# Patient Record
Sex: Female | Born: 1975 | Race: Black or African American | Hispanic: No | Marital: Married | Smoking: Never smoker
Health system: Southern US, Community
[De-identification: ages and names within clinical notes are randomized; demographics above are authoritative.]

## PROBLEM LIST (undated history)

## (undated) DIAGNOSIS — O24419 Gestational diabetes mellitus in pregnancy, unspecified control: Secondary | ICD-10-CM

---

## 1998-05-26 ENCOUNTER — Other Ambulatory Visit: Admission: RE | Admit: 1998-05-26 | Discharge: 1998-05-26 | Payer: Self-pay | Admitting: Obstetrics

## 1998-05-26 ENCOUNTER — Encounter: Admission: RE | Admit: 1998-05-26 | Discharge: 1998-05-26 | Payer: Self-pay | Admitting: Obstetrics

## 1998-06-30 ENCOUNTER — Encounter: Admission: RE | Admit: 1998-06-30 | Discharge: 1998-06-30 | Payer: Self-pay | Admitting: Obstetrics & Gynecology

## 1998-08-28 ENCOUNTER — Encounter: Admission: RE | Admit: 1998-08-28 | Discharge: 1998-08-28 | Payer: Self-pay | Admitting: Obstetrics & Gynecology

## 1998-08-28 ENCOUNTER — Other Ambulatory Visit: Admission: RE | Admit: 1998-08-28 | Discharge: 1998-08-28 | Payer: Self-pay | Admitting: Obstetrics & Gynecology

## 1998-09-11 ENCOUNTER — Encounter: Admission: RE | Admit: 1998-09-11 | Discharge: 1998-09-11 | Payer: Self-pay | Admitting: Obstetrics & Gynecology

## 1998-10-02 ENCOUNTER — Encounter: Admission: RE | Admit: 1998-10-02 | Discharge: 1998-10-02 | Payer: Self-pay | Admitting: Obstetrics & Gynecology

## 1998-10-07 ENCOUNTER — Encounter: Admission: RE | Admit: 1998-10-07 | Discharge: 1998-10-07 | Payer: Self-pay | Admitting: Obstetrics & Gynecology

## 1999-11-29 ENCOUNTER — Other Ambulatory Visit: Admission: RE | Admit: 1999-11-29 | Discharge: 1999-11-29 | Payer: Self-pay | Admitting: Family Medicine

## 2002-11-08 ENCOUNTER — Encounter: Payer: Self-pay | Admitting: Family Medicine

## 2002-11-08 ENCOUNTER — Inpatient Hospital Stay (HOSPITAL_COMMUNITY): Admission: AD | Admit: 2002-11-08 | Discharge: 2002-11-08 | Payer: Self-pay | Admitting: Family Medicine

## 2004-10-01 ENCOUNTER — Ambulatory Visit: Payer: Self-pay | Admitting: Family Medicine

## 2004-10-27 ENCOUNTER — Ambulatory Visit: Payer: Self-pay | Admitting: *Deleted

## 2004-10-29 ENCOUNTER — Ambulatory Visit: Payer: Self-pay | Admitting: Family Medicine

## 2005-02-28 ENCOUNTER — Ambulatory Visit: Payer: Self-pay | Admitting: Family Medicine

## 2005-03-02 ENCOUNTER — Inpatient Hospital Stay (HOSPITAL_COMMUNITY): Admission: AD | Admit: 2005-03-02 | Discharge: 2005-03-02 | Payer: Self-pay | Admitting: Family Medicine

## 2005-04-18 ENCOUNTER — Inpatient Hospital Stay (HOSPITAL_COMMUNITY): Admission: AD | Admit: 2005-04-18 | Discharge: 2005-04-20 | Payer: Self-pay | Admitting: Obstetrics & Gynecology

## 2005-04-28 ENCOUNTER — Encounter: Admission: RE | Admit: 2005-04-28 | Discharge: 2005-05-26 | Payer: Self-pay | Admitting: Obstetrics & Gynecology

## 2005-05-12 ENCOUNTER — Inpatient Hospital Stay (HOSPITAL_COMMUNITY): Admission: AD | Admit: 2005-05-12 | Discharge: 2005-05-16 | Payer: Self-pay | Admitting: Obstetrics

## 2005-05-16 ENCOUNTER — Encounter (INDEPENDENT_AMBULATORY_CARE_PROVIDER_SITE_OTHER): Payer: Self-pay | Admitting: *Deleted

## 2006-08-21 ENCOUNTER — Inpatient Hospital Stay (HOSPITAL_COMMUNITY): Admission: AD | Admit: 2006-08-21 | Discharge: 2006-08-21 | Payer: Self-pay | Admitting: Gynecology

## 2006-08-30 ENCOUNTER — Inpatient Hospital Stay (HOSPITAL_COMMUNITY): Admission: AD | Admit: 2006-08-30 | Discharge: 2006-08-30 | Payer: Self-pay | Admitting: Obstetrics and Gynecology

## 2006-09-12 ENCOUNTER — Ambulatory Visit (HOSPITAL_COMMUNITY): Admission: RE | Admit: 2006-09-12 | Discharge: 2006-09-12 | Payer: Self-pay | Admitting: Family Medicine

## 2006-09-18 ENCOUNTER — Ambulatory Visit: Payer: Self-pay | Admitting: Family Medicine

## 2006-09-19 ENCOUNTER — Ambulatory Visit (HOSPITAL_COMMUNITY): Admission: RE | Admit: 2006-09-19 | Discharge: 2006-09-19 | Payer: Self-pay | Admitting: Obstetrics & Gynecology

## 2006-09-21 ENCOUNTER — Ambulatory Visit: Payer: Self-pay | Admitting: Obstetrics & Gynecology

## 2006-09-25 ENCOUNTER — Inpatient Hospital Stay (HOSPITAL_COMMUNITY): Admission: AD | Admit: 2006-09-25 | Discharge: 2006-09-25 | Payer: Self-pay | Admitting: Obstetrics & Gynecology

## 2006-10-03 ENCOUNTER — Ambulatory Visit (HOSPITAL_COMMUNITY): Admission: RE | Admit: 2006-10-03 | Discharge: 2006-10-03 | Payer: Self-pay | Admitting: Family Medicine

## 2006-10-05 ENCOUNTER — Ambulatory Visit: Payer: Self-pay | Admitting: Obstetrics & Gynecology

## 2006-10-17 ENCOUNTER — Ambulatory Visit (HOSPITAL_COMMUNITY): Admission: RE | Admit: 2006-10-17 | Discharge: 2006-10-17 | Payer: Self-pay | Admitting: Obstetrics & Gynecology

## 2006-10-18 ENCOUNTER — Ambulatory Visit: Payer: Self-pay | Admitting: *Deleted

## 2006-10-18 ENCOUNTER — Inpatient Hospital Stay (HOSPITAL_COMMUNITY): Admission: AD | Admit: 2006-10-18 | Discharge: 2006-10-20 | Payer: Self-pay | Admitting: Gynecology

## 2006-10-18 ENCOUNTER — Ambulatory Visit: Payer: Self-pay | Admitting: Obstetrics and Gynecology

## 2006-10-25 ENCOUNTER — Ambulatory Visit (HOSPITAL_COMMUNITY): Admission: RE | Admit: 2006-10-25 | Discharge: 2006-10-25 | Payer: Self-pay | Admitting: Obstetrics & Gynecology

## 2006-10-30 ENCOUNTER — Ambulatory Visit: Payer: Self-pay | Admitting: Obstetrics & Gynecology

## 2006-10-31 ENCOUNTER — Ambulatory Visit (HOSPITAL_COMMUNITY): Admission: RE | Admit: 2006-10-31 | Discharge: 2006-10-31 | Payer: Self-pay | Admitting: Obstetrics & Gynecology

## 2006-11-02 ENCOUNTER — Ambulatory Visit: Payer: Self-pay | Admitting: Gynecology

## 2006-11-09 ENCOUNTER — Ambulatory Visit: Payer: Self-pay | Admitting: Obstetrics & Gynecology

## 2006-11-14 ENCOUNTER — Ambulatory Visit: Payer: Self-pay | Admitting: *Deleted

## 2006-11-14 ENCOUNTER — Inpatient Hospital Stay (HOSPITAL_COMMUNITY): Admission: AD | Admit: 2006-11-14 | Discharge: 2006-11-14 | Payer: Self-pay | Admitting: Obstetrics and Gynecology

## 2006-11-16 ENCOUNTER — Ambulatory Visit: Payer: Self-pay | Admitting: Family Medicine

## 2006-11-17 ENCOUNTER — Ambulatory Visit: Payer: Self-pay | Admitting: Family Medicine

## 2006-11-20 ENCOUNTER — Ambulatory Visit: Payer: Self-pay | Admitting: Obstetrics & Gynecology

## 2006-11-20 ENCOUNTER — Ambulatory Visit (HOSPITAL_COMMUNITY): Admission: RE | Admit: 2006-11-20 | Discharge: 2006-11-20 | Payer: Self-pay | Admitting: Obstetrics & Gynecology

## 2006-11-20 ENCOUNTER — Encounter: Admission: RE | Admit: 2006-11-20 | Discharge: 2007-02-18 | Payer: Self-pay | Admitting: Obstetrics & Gynecology

## 2006-11-27 ENCOUNTER — Ambulatory Visit: Payer: Self-pay | Admitting: Obstetrics & Gynecology

## 2006-11-28 ENCOUNTER — Ambulatory Visit (HOSPITAL_COMMUNITY): Admission: RE | Admit: 2006-11-28 | Discharge: 2006-11-28 | Payer: Self-pay | Admitting: Obstetrics & Gynecology

## 2006-12-04 ENCOUNTER — Ambulatory Visit: Payer: Self-pay | Admitting: Family Medicine

## 2006-12-08 ENCOUNTER — Ambulatory Visit (HOSPITAL_COMMUNITY): Admission: RE | Admit: 2006-12-08 | Discharge: 2006-12-08 | Payer: Self-pay | Admitting: Obstetrics & Gynecology

## 2006-12-11 ENCOUNTER — Ambulatory Visit: Payer: Self-pay | Admitting: Obstetrics & Gynecology

## 2006-12-18 ENCOUNTER — Ambulatory Visit: Payer: Self-pay | Admitting: Family Medicine

## 2006-12-21 ENCOUNTER — Ambulatory Visit (HOSPITAL_COMMUNITY): Admission: RE | Admit: 2006-12-21 | Discharge: 2006-12-21 | Payer: Self-pay | Admitting: Obstetrics & Gynecology

## 2006-12-25 ENCOUNTER — Ambulatory Visit: Payer: Self-pay | Admitting: Family Medicine

## 2007-01-01 ENCOUNTER — Ambulatory Visit: Payer: Self-pay | Admitting: Obstetrics & Gynecology

## 2007-01-08 ENCOUNTER — Ambulatory Visit: Payer: Self-pay | Admitting: Family Medicine

## 2007-01-12 ENCOUNTER — Ambulatory Visit (HOSPITAL_COMMUNITY): Admission: RE | Admit: 2007-01-12 | Discharge: 2007-01-12 | Payer: Self-pay | Admitting: Obstetrics & Gynecology

## 2007-01-16 ENCOUNTER — Ambulatory Visit: Payer: Self-pay | Admitting: Obstetrics & Gynecology

## 2007-01-22 ENCOUNTER — Ambulatory Visit: Payer: Self-pay | Admitting: Obstetrics & Gynecology

## 2007-01-24 ENCOUNTER — Ambulatory Visit: Payer: Self-pay | Admitting: Obstetrics & Gynecology

## 2007-01-29 ENCOUNTER — Ambulatory Visit: Payer: Self-pay | Admitting: Obstetrics & Gynecology

## 2007-02-02 ENCOUNTER — Ambulatory Visit (HOSPITAL_COMMUNITY): Admission: RE | Admit: 2007-02-02 | Discharge: 2007-02-02 | Payer: Self-pay | Admitting: Obstetrics & Gynecology

## 2007-02-05 ENCOUNTER — Ambulatory Visit: Payer: Self-pay | Admitting: Family Medicine

## 2007-02-09 ENCOUNTER — Ambulatory Visit: Payer: Self-pay | Admitting: Gynecology

## 2007-02-09 ENCOUNTER — Ambulatory Visit: Payer: Self-pay | Admitting: Physician Assistant

## 2007-02-09 ENCOUNTER — Inpatient Hospital Stay (HOSPITAL_COMMUNITY): Admission: AD | Admit: 2007-02-09 | Discharge: 2007-02-09 | Payer: Self-pay | Admitting: Obstetrics & Gynecology

## 2007-02-12 ENCOUNTER — Ambulatory Visit: Payer: Self-pay | Admitting: Obstetrics & Gynecology

## 2007-02-12 ENCOUNTER — Ambulatory Visit (HOSPITAL_COMMUNITY): Admission: RE | Admit: 2007-02-12 | Discharge: 2007-02-12 | Payer: Self-pay | Admitting: Family Medicine

## 2007-02-16 ENCOUNTER — Encounter: Payer: Self-pay | Admitting: *Deleted

## 2007-02-16 ENCOUNTER — Ambulatory Visit: Payer: Self-pay | Admitting: Obstetrics and Gynecology

## 2007-02-16 ENCOUNTER — Inpatient Hospital Stay (HOSPITAL_COMMUNITY): Admission: AD | Admit: 2007-02-16 | Discharge: 2007-02-16 | Payer: Self-pay | Admitting: Gynecology

## 2007-02-19 ENCOUNTER — Ambulatory Visit: Payer: Self-pay | Admitting: Obstetrics & Gynecology

## 2007-02-22 ENCOUNTER — Ambulatory Visit (HOSPITAL_COMMUNITY): Admission: RE | Admit: 2007-02-22 | Discharge: 2007-02-22 | Payer: Self-pay | Admitting: Gynecology

## 2007-02-22 ENCOUNTER — Ambulatory Visit: Payer: Self-pay | Admitting: Family Medicine

## 2007-02-26 ENCOUNTER — Encounter: Admission: RE | Admit: 2007-02-26 | Discharge: 2007-02-26 | Payer: Self-pay | Admitting: Obstetrics & Gynecology

## 2007-02-26 ENCOUNTER — Inpatient Hospital Stay (HOSPITAL_COMMUNITY): Admission: AD | Admit: 2007-02-26 | Discharge: 2007-02-27 | Payer: Self-pay | Admitting: Obstetrics and Gynecology

## 2007-02-26 ENCOUNTER — Encounter: Payer: Self-pay | Admitting: *Deleted

## 2007-02-26 ENCOUNTER — Ambulatory Visit: Payer: Self-pay | Admitting: Obstetrics and Gynecology

## 2007-02-26 ENCOUNTER — Ambulatory Visit: Payer: Self-pay | Admitting: Obstetrics & Gynecology

## 2007-03-01 ENCOUNTER — Ambulatory Visit: Payer: Self-pay | Admitting: Physician Assistant

## 2007-03-01 ENCOUNTER — Inpatient Hospital Stay (HOSPITAL_COMMUNITY): Admission: AD | Admit: 2007-03-01 | Discharge: 2007-03-01 | Payer: Self-pay | Admitting: Obstetrics & Gynecology

## 2007-03-02 ENCOUNTER — Ambulatory Visit (HOSPITAL_COMMUNITY): Admission: RE | Admit: 2007-03-02 | Discharge: 2007-03-02 | Payer: Self-pay | Admitting: Obstetrics & Gynecology

## 2007-03-05 ENCOUNTER — Ambulatory Visit (HOSPITAL_COMMUNITY): Admission: RE | Admit: 2007-03-05 | Discharge: 2007-03-05 | Payer: Self-pay | Admitting: Family Medicine

## 2007-03-05 ENCOUNTER — Ambulatory Visit: Payer: Self-pay | Admitting: Obstetrics & Gynecology

## 2007-03-09 ENCOUNTER — Ambulatory Visit: Payer: Self-pay | Admitting: Gynecology

## 2007-03-10 ENCOUNTER — Inpatient Hospital Stay (HOSPITAL_COMMUNITY): Admission: AD | Admit: 2007-03-10 | Discharge: 2007-03-12 | Payer: Self-pay | Admitting: Gynecology

## 2007-03-10 ENCOUNTER — Ambulatory Visit: Payer: Self-pay | Admitting: Obstetrics & Gynecology

## 2007-11-19 ENCOUNTER — Ambulatory Visit: Payer: Self-pay | Admitting: Internal Medicine

## 2007-11-19 ENCOUNTER — Encounter (INDEPENDENT_AMBULATORY_CARE_PROVIDER_SITE_OTHER): Payer: Self-pay | Admitting: Internal Medicine

## 2007-11-19 ENCOUNTER — Encounter (INDEPENDENT_AMBULATORY_CARE_PROVIDER_SITE_OTHER): Payer: Self-pay | Admitting: Nurse Practitioner

## 2007-11-19 DIAGNOSIS — K644 Residual hemorrhoidal skin tags: Secondary | ICD-10-CM | POA: Insufficient documentation

## 2007-11-19 DIAGNOSIS — N8111 Cystocele, midline: Secondary | ICD-10-CM

## 2007-11-19 LAB — CONVERTED CEMR LAB
AST: 12 units/L (ref 0–37)
Albumin: 4.7 g/dL (ref 3.5–5.2)
BUN: 17 mg/dL (ref 6–23)
Basophils Absolute: 0 10*3/uL (ref 0.0–0.1)
Basophils Relative: 0 % (ref 0–1)
Bilirubin Urine: NEGATIVE
Blood in Urine, dipstick: NEGATIVE
Calcium: 9.3 mg/dL (ref 8.4–10.5)
Chloride: 105 meq/L (ref 96–112)
Creatinine, Ser: 0.87 mg/dL (ref 0.40–1.20)
Eosinophils Relative: 1 % (ref 0–5)
Glucose, Bld: 71 mg/dL (ref 70–99)
Glucose, Urine, Semiquant: NEGATIVE
HCT: 44.5 % (ref 36.0–46.0)
Hemoglobin: 14 g/dL (ref 12.0–15.0)
Lymphocytes Relative: 26 % (ref 12–46)
MCHC: 31.5 g/dL (ref 30.0–36.0)
Monocytes Absolute: 0.5 10*3/uL (ref 0.1–1.0)
Monocytes Relative: 5 % (ref 3–12)
Nitrite: NEGATIVE
RBC: 4.96 M/uL (ref 3.87–5.11)
RDW: 14.8 % (ref 11.5–15.5)
TSH: 0.669 microintl units/mL (ref 0.350–5.50)

## 2007-11-20 ENCOUNTER — Encounter (INDEPENDENT_AMBULATORY_CARE_PROVIDER_SITE_OTHER): Payer: Self-pay | Admitting: Nurse Practitioner

## 2007-11-21 ENCOUNTER — Ambulatory Visit: Payer: Self-pay | Admitting: Nurse Practitioner

## 2008-05-26 ENCOUNTER — Telehealth (INDEPENDENT_AMBULATORY_CARE_PROVIDER_SITE_OTHER): Payer: Self-pay | Admitting: *Deleted

## 2008-05-27 ENCOUNTER — Ambulatory Visit: Payer: Self-pay | Admitting: Nurse Practitioner

## 2008-05-27 DIAGNOSIS — B9789 Other viral agents as the cause of diseases classified elsewhere: Secondary | ICD-10-CM

## 2008-10-10 ENCOUNTER — Ambulatory Visit: Payer: Self-pay | Admitting: Nurse Practitioner

## 2008-10-10 DIAGNOSIS — J309 Allergic rhinitis, unspecified: Secondary | ICD-10-CM | POA: Insufficient documentation

## 2008-10-10 DIAGNOSIS — R3129 Other microscopic hematuria: Secondary | ICD-10-CM | POA: Insufficient documentation

## 2008-10-10 DIAGNOSIS — R141 Gas pain: Secondary | ICD-10-CM

## 2008-10-10 DIAGNOSIS — R030 Elevated blood-pressure reading, without diagnosis of hypertension: Secondary | ICD-10-CM | POA: Insufficient documentation

## 2008-10-10 DIAGNOSIS — N898 Other specified noninflammatory disorders of vagina: Secondary | ICD-10-CM | POA: Insufficient documentation

## 2008-10-10 DIAGNOSIS — R143 Flatulence: Secondary | ICD-10-CM

## 2008-10-10 DIAGNOSIS — R142 Eructation: Secondary | ICD-10-CM

## 2008-10-10 LAB — CONVERTED CEMR LAB
Glucose, Urine, Semiquant: NEGATIVE
Nitrite: NEGATIVE
Specific Gravity, Urine: 1.02
pH: 6.5

## 2008-10-11 ENCOUNTER — Encounter (INDEPENDENT_AMBULATORY_CARE_PROVIDER_SITE_OTHER): Payer: Self-pay | Admitting: Nurse Practitioner

## 2009-11-21 ENCOUNTER — Emergency Department (HOSPITAL_COMMUNITY): Admission: EM | Admit: 2009-11-21 | Discharge: 2009-11-22 | Payer: Self-pay | Admitting: Emergency Medicine

## 2010-02-12 ENCOUNTER — Ambulatory Visit (HOSPITAL_COMMUNITY): Admission: RE | Admit: 2010-02-12 | Discharge: 2010-02-12 | Payer: Self-pay | Admitting: Chiropractic Medicine

## 2010-04-01 ENCOUNTER — Encounter (INDEPENDENT_AMBULATORY_CARE_PROVIDER_SITE_OTHER): Payer: Self-pay | Admitting: Nurse Practitioner

## 2010-09-12 ENCOUNTER — Encounter: Payer: Self-pay | Admitting: *Deleted

## 2010-09-21 NOTE — Letter (Signed)
Summary: MAILED REQUESTED RECORDS/EXAM ONE  MAILED REQUESTED RECORDS/EXAM ONE   Imported By: Arta Bruce 04/01/2010 11:09:48  _____________________________________________________________________  External Attachment:    Type:   Image     Comment:   External Document

## 2011-01-04 NOTE — Op Note (Signed)
NAMEBRIANY, [name]             ACCOUNT NO.:  [id number]   MEDICAL RECORD NO.:  [id number]          PATIENT TYPE:  WOC   LOCATION:  WOC                          FACILITY:  [facility]   PHYSICIAN:  Allie Bossier, MD        DATE OF BIRTH:  06/17/1976   DATE OF PROCEDURE:  03/11/2007  DATE OF DISCHARGE:                               OPERATIVE REPORT   PREOPERATIVE DIAGNOSIS:  Multiparity, desires sterility.   POSTOPERATIVE DIAGNOSIS:  Multiparity, desires sterility.   SURGEON:  Clarisa Kindred, M.D.   ANESTHESIA:  Epidural, Pamalee Leyden, M.D.   COMPLICATIONS:  None.   EBL:  Minimal.   SPECIMENS:  None.   FINDING:  Normal appearing oviducts.   DETAILED PROCEDURE AND FINDINGS:  The failure rate of 1% was discussed  with the patient.  She understands this and accepts it.  She declines  alternative forms of birth control.  She was taken to the operating room  and her epidural was bolused for surgery.  Her abdomen was prepped and  draped in the usual sterile fashion.  Approximately 4 mL of 0.5%  Marcaine was injected into her umbilicus.  A vertical incision was made  in the umbilicus.  The fascia was incised.  The peritoneum was entered  with hemostats.  Using [employer] retractors, the oviduct on the right  was visualized, it was grasped with the Babcock clamp and traced to its  fimbriated end.  It was then retraced to the isthmic region where a  Filshie clip was placed in the isthmic region across the entire tube.  Approximately 1 mL of 0.5% Marcaine was injected next to the clip for  postop pain management and repeat procedure was performed on the other  side again, the fimbria was visualized.  Excellent hemostasis was noted.  A moderate amount of acidic fluid was noted upon entry into the  peritoneal cavity.  The fascia was elevated with Allis clamps and closed  with #0 Vicryl running nonlocking fashion.  Subcuticular closure was  done with #4-0 Vicryl.  Excellent cosmetic results were  obtained.  She  was taken to the recovery room in stable condition with the instrument,  sponge, needle counts correct.      Allie Bossier, MD  Electronically Signed     MCD/MEDQ  D:  03/11/2007  T:  03/11/2007  Job:  [id number]

## 2011-01-07 NOTE — Discharge Summary (Signed)
[name], [name]             ACCOUNT NO.:  [id number]   MEDICAL RECORD NO.:  [id number]          PATIENT TYPE:  INP   LOCATION:  9308                          [facility]   PHYSICIAN:  [name], M.D.DATE OF BIRTH:  1976/03/14   DATE OF ADMISSION:  04/18/2005  DATE OF DISCHARGE:  04/20/2005                                 DISCHARGE SUMMARY   CHIEF COMPLAINT:  The patient is a 35 year old, gravida 8, para 3 with an  intrauterine pregnancy at [redacted] weeks gestation complaining of abdominal  pressure.   HISTORY OF PRESENT ILLNESS:  Please see the above. She denies any vomiting,  diarrhea.   SOCIAL HISTORY:  She denies any tobacco, ethanol or substance use.   PAST OB/GYN HISTORY:  She  had a remote history of chlamydia; cervical  dysplasia and she is status post a LEEP 5-7 years ago.   PAST SURGICAL HISTORY:  Oral surgery.   PHYSICAL EXAMINATION:  VITAL SIGNS:  Stable, afebrile.  DIGITAL CERVICAL EXAM:  The cervix is long and closed.   OTHER LABORATORY WORK AND FINDINGS:  Urinalysis essentially normal.   ASSESSMENT:  Intrauterine pregnancy at 14 plus weeks, lower abdominal pain,  questionable etiology.   PLAN:  Admission, supportive management. The patient was admitted. An  ultrasound on August 28 demonstrated a viable intrauterine pregnancy, normal  cervical length.   LABORATORY DATA:  White blood cell count 14,000 otherwise the remainder of  the CBC was within normal limits. GC and chlamydia, DNA probes were  negative. Wet prep was negative. The patient was given Tylenol #3 for pain  control as well as a GI cocktail and proton pump inhibitor. Her pain  resolved and she was discharged to home on August 30.   DISCHARGE DIAGNOSES:  Intrauterine pregnancy at 14 weeks with lower  abdominal pain of questionable etiology.   CONDITION ON DISCHARGE:  Stable.   DIET:  Regular.   ACTIVITY:  Ad lib.   MEDICATIONS:  Prenatal vitamins.   DISPOSITION:  The patient  was to followup in the office on September 1.      [name], M.D.  Electronically Signed     LAJ/MEDQ  D:  07/06/2005  T:  07/07/2005  Job:  [id number]

## 2011-01-07 NOTE — Discharge Summary (Signed)
[name], [name]             ACCOUNT NO.:  [id number]   MEDICAL RECORD NO.:  [id number]          PATIENT TYPE:  INP   LOCATION:  9371                          [facility]   PHYSICIAN:  Roseanna Rainbow, M.D.DATE OF BIRTH:  08-Jan-1976   DATE OF ADMISSION:  05/12/2005  DATE OF DISCHARGE:  05/16/2005                                 DISCHARGE SUMMARY   CHIEF COMPLAINT:  The patient is a 35 year old, gravida 8, para 3, with an  estimated date of confinement of October 18, 2005, complaining of gush of  clear fluid.   HISTORY OF PRESENT ILLNESS:  Please see the above.  She denies dysuria,  fever, rigors, or nausea or vomiting.   PAST SURGICAL HISTORY:  She is status post a LEEP; oral surgery .   PAST MEDICAL HISTORY:  She denies.   MEDICATIONS:  Prenatal vitamins and Protonix.   ALLERGIES:  PENICILLIN.   SOCIAL HISTORY:  She denies any tobacco, ethanol, or drug use.   PHYSICAL EXAMINATION:  VITAL SIGNS:  Temperature 99, pulse 104, blood  pressure 111/60.  ABDOMEN:  Soft, nontender.  STERILE SPECULUM EXAM:  Gross pooling of clear fluid.  Cervix appears  closed.  Positive ferning.   ASSESSMENT:  Intrauterine pregnancy at 17+ weeks with preterm premature  rupture of membranes.   PLAN:  Admission, bed rest, expectant management.   HOSPITAL COURSE:  The patient was admitted.  She was started on broad-  spectrum antibiotics.  An ultrasound was consistent with an hydramnios and a  poor prognosis for the pregnancy was reviewed with the patient.  On  September 23, the cord prolapsed.  At this point the she was felt to have an  inevitable AB and she agreed to Cytotec induction.  On September 25, the  fetus was passed and the placenta was delivered with assistance.  She was  discharged to home later that day.   DISCHARGE DIAGNOSES:  Inevitable abortion at 17 weeks.   OPERATION/PROCEDURE:  Cytotec induction.   CONDITION:  Stable.   DIET:  Regular.   ACTIVITY:  Pelvic  rest.   MEDICATIONS:  Ibuprofen, Ambien.   DISPOSITION:  The patient was to follow up in the office in one week in  several weeks.      Roseanna Rainbow, M.D.  Electronically Signed     LAJ/MEDQ  D:  06/19/2005  T:  06/20/2005  Job:  [id number]

## 2011-01-07 NOTE — Op Note (Signed)
[name], [name]             ACCOUNT NO.:  [id number]   MEDICAL RECORD NO.:  [id number]          PATIENT TYPE:  INP   LOCATION:  9302                          [facility]   PHYSICIAN:  Phil D. Okey Dupre, M.D.     DATE OF BIRTH:  04-07-1976   DATE OF PROCEDURE:  10/19/2006  DATE OF DISCHARGE:                               OPERATIVE REPORT   PROCEDURE:  Cervical cerclage.   PREOPERATIVE DIAGNOSIS:  A 16-6/7-week gestation with a weakened cervix.   SURGEON:  Javier Glazier. Okey Dupre, M.D.   ANESTHESIA:  Spinal.   ESTIMATED BLOOD LOSS:  10 mL   POSTOPERATIVE CONDITION:  Satisfactory.   SPECIMENS TO PATHOLOGY:  None   OPERATIVE FINDINGS:  With the patient in dorsal lithotomy position, the  external genitalia was normal, BUS within normal limits.  The vagina was  clean, well-rugated, with a first, almost second-degree cystocele.  The  cervix was clean and parous, approximately 3 cm in length and closed  external os.   PROCEDURE:  Under satisfactory spinal anesthesia, the patient in the  dorsal lithotomy position, the perineum and vagina were prepped and  draped in the usual sterile manner.  Operative findings:  The initial  findings were as above.  A weighted speculum was placed in the posterior  fourchette of the vagina and a Deaver retractor was used to hold the  bladder up, and the mucosa under her bladder was injected with 1%  Xylocaine with 1:100,000 epinephrine.  A transverse incision of 1.5 cm  was made 3 cm above the distal end of the cervix, the bladder pushed  away from the open incision.  The posterior lip of the cervix was then  grasped with a ring forceps, brought up, and approximately 2.5-3 cm from  the distal a similar injection was made with a transverse incision  pushing the mucosa away from the distal end of the incision opening.  An  attempt was then made using a large needle and a 5 mm Mersilene suture  going from anterior to posterior; however the needle was not  sharp  enough for me to go the entire way.  I therefore went out of the cervix  approximately 2 cm from the distal end at 9 o'clock, back in at 9  o'clock, and out at 6 o'clock in the area of the mucosal opening.  A  similar motion was carried out on the left side of the cervix, and this  suture was tied posteriorly, snugging down the cervix.  The bladder  mucosa was then resutured to the cervix with figure-of-eight 3-0 chromic  catgut suture for hemostasis.  No bleeding was noted at the end of the  procedure, which went well.  The tail on the tie was left long so the knot could be easily  identified, and the patient was told that the knot is posterior.  The  patient was then transferred to the recovery room in satisfactory  condition, having tolerated the procedure well.           ______________________________  Javier Glazier. Okey Dupre, M.D.     PDR/MEDQ  D:  10/19/2006  T:  10/19/2006  Job:  [id number]

## 2011-01-07 NOTE — Discharge Summary (Signed)
[name], [name]             ACCOUNT NO.:  [id number]   MEDICAL RECORD NO.:  [id number]          PATIENT TYPE:  INP   LOCATION:                                [facility]   PHYSICIAN:  Phil D. Rose, M.D.     DATE OF BIRTH:  1976-08-16   DATE OF ADMISSION:  10/18/2006  DATE OF DISCHARGE:                               DISCHARGE SUMMARY   The patient, a 35 year old gravida 9, para 2-1-5-3 at 57 and five-  sevenths weeks on admission and 17 weeks at the time of discharge, was  scheduled on October 19, 2006, for a cervical cerclage after being  evaluated on February 26 by maternal-fetal medicine, noticing that the  cervix was beginning to shorten in this patient whose last child was  preterm, and they suggested a cerclage.  The patient was to be admitted  on February 28.  However, on February 27 she went into the clinic with  contractions, cramping, was admitted for observation, started on  preoperative antibiotics, and on the day after admission - i.e. February  28 - underwent cervical cerclage, modified Shirodkar with 5-mm Mersilene  suture.  The patient has done fine postoperatively, complains mainly of  a backache, probably more secondary to bedrest.  Has no sign of vaginal  bleeding.  No postoperative labs have been done.  The patient is going  to be discharged home.  Discharge instructions as to followup have been  given and she will be made an appointment with maternal-fetal medicine  to be followed this coming week, as well as the following week with high-  risk OB clinic.   IMPRESSION:  Status satisfactory post cerclage for weak cervix and  history of preterm delivery.           ______________________________  Javier Glazier Okey Dupre, M.D.     PDR/MEDQ  D:  10/20/2006  T:  10/20/2006  Job:  [id number]

## 2011-06-06 LAB — URINALYSIS, ROUTINE W REFLEX MICROSCOPIC
Bilirubin Urine: NEGATIVE
Glucose, UA: NEGATIVE
Ketones, ur: NEGATIVE
Protein, ur: NEGATIVE
pH: 5.5

## 2011-06-06 LAB — CBC
HCT: 37.7
HCT: 38.5
Hemoglobin: 12.4
Hemoglobin: 12.8
MCHC: 33.1
MCHC: 33.2
MCV: 87.1
MCV: 87.4
Platelets: 227
RBC: 4.28
RBC: 4.33
RDW: 15.6 — ABNORMAL HIGH
WBC: 12.6 — ABNORMAL HIGH
WBC: 12.7 — ABNORMAL HIGH
WBC: 15.1 — ABNORMAL HIGH

## 2011-06-06 LAB — URINE MICROSCOPIC-ADD ON

## 2011-06-07 LAB — POCT URINALYSIS DIP (DEVICE)
Bilirubin Urine: NEGATIVE
Glucose, UA: NEGATIVE
Ketones, ur: NEGATIVE
Ketones, ur: NEGATIVE
Nitrite: NEGATIVE
Operator id: 135281
Protein, ur: 30 — AB
Protein, ur: 30 — AB
Specific Gravity, Urine: 1.015
Specific Gravity, Urine: 1.015
Urobilinogen, UA: 0.2
Urobilinogen, UA: 0.2
pH: 5.5
pH: 6.5

## 2011-06-07 LAB — CBC
HCT: 39.3
Hemoglobin: 12.9
MCHC: 32.8
MCV: 86.8
Platelets: 242
RBC: 4.53
RDW: 16 — ABNORMAL HIGH
WBC: 14.1 — ABNORMAL HIGH

## 2011-06-07 LAB — RPR: RPR Ser Ql: NONREACTIVE

## 2011-06-07 LAB — TYPE AND SCREEN
ABO/RH(D): AB POS
Antibody Screen: NEGATIVE

## 2011-06-07 LAB — ABO/RH: ABO/RH(D): AB POS

## 2011-06-08 LAB — URINALYSIS, ROUTINE W REFLEX MICROSCOPIC
Hgb urine dipstick: NEGATIVE
Ketones, ur: NEGATIVE
Nitrite: NEGATIVE
Protein, ur: NEGATIVE
Specific Gravity, Urine: <1.005 — ABNORMAL LOW
Urobilinogen, UA: 0.2
pH: 5.5
pH: 7

## 2011-06-08 LAB — POCT URINALYSIS DIP (DEVICE)
Bilirubin Urine: NEGATIVE
Glucose, UA: NEGATIVE
Glucose, UA: NEGATIVE
Glucose, UA: NEGATIVE
Ketones, ur: NEGATIVE
Ketones, ur: NEGATIVE
Nitrite: NEGATIVE
Nitrite: NEGATIVE
Operator id: 148111
Operator id: 194561
Protein, ur: NEGATIVE
Protein, ur: NEGATIVE
Specific Gravity, Urine: 1.01
Specific Gravity, Urine: 1.02
Urobilinogen, UA: 0.2
Urobilinogen, UA: 0.2

## 2011-06-08 LAB — STREP B DNA PROBE

## 2011-06-08 LAB — FETAL FIBRONECTIN: Fetal Fibronectin: NEGATIVE

## 2011-06-09 LAB — POCT URINALYSIS DIP (DEVICE)
Glucose, UA: NEGATIVE
Glucose, UA: NEGATIVE
Nitrite: NEGATIVE
Nitrite: NEGATIVE
Operator id: 159681
Operator id: 234181
Protein, ur: NEGATIVE
Specific Gravity, Urine: 1.015
Specific Gravity, Urine: 1.015
Urobilinogen, UA: 0.2
Urobilinogen, UA: 0.2

## 2011-11-28 ENCOUNTER — Other Ambulatory Visit (HOSPITAL_COMMUNITY)
Admission: RE | Admit: 2011-11-28 | Discharge: 2011-11-28 | Disposition: A | Discharge status: Home / Self Care | Payer: BC Managed Care – PPO | Source: Ambulatory Visit | Attending: Family Medicine | Admitting: Family Medicine

## 2011-11-28 ENCOUNTER — Other Ambulatory Visit: Payer: Self-pay | Admitting: Family Medicine

## 2011-11-28 DIAGNOSIS — Z124 Encounter for screening for malignant neoplasm of cervix: Secondary | ICD-10-CM | POA: Insufficient documentation

## 2014-02-19 ENCOUNTER — Encounter: Payer: Self-pay | Admitting: Medical

## 2014-04-16 ENCOUNTER — Emergency Department (HOSPITAL_COMMUNITY): Payer: Worker's Compensation

## 2014-04-16 ENCOUNTER — Emergency Department (HOSPITAL_COMMUNITY)
Admission: EM | Admit: 2014-04-16 | Discharge: 2014-04-17 | Disposition: A | Discharge status: Home / Self Care | Payer: Worker's Compensation | Attending: Emergency Medicine | Admitting: Emergency Medicine

## 2014-04-16 ENCOUNTER — Encounter (HOSPITAL_COMMUNITY): Payer: Self-pay | Admitting: Emergency Medicine

## 2014-04-16 DIAGNOSIS — S4980XA Other specified injuries of shoulder and upper arm, unspecified arm, initial encounter: Secondary | ICD-10-CM | POA: Insufficient documentation

## 2014-04-16 DIAGNOSIS — Y92410 Unspecified street and highway as the place of occurrence of the external cause: Secondary | ICD-10-CM | POA: Diagnosis not present

## 2014-04-16 DIAGNOSIS — Z79899 Other long term (current) drug therapy: Secondary | ICD-10-CM | POA: Diagnosis not present

## 2014-04-16 DIAGNOSIS — S99929A Unspecified injury of unspecified foot, initial encounter: Secondary | ICD-10-CM

## 2014-04-16 DIAGNOSIS — S8990XA Unspecified injury of unspecified lower leg, initial encounter: Secondary | ICD-10-CM | POA: Insufficient documentation

## 2014-04-16 DIAGNOSIS — Y9389 Activity, other specified: Secondary | ICD-10-CM | POA: Diagnosis not present

## 2014-04-16 DIAGNOSIS — S99919A Unspecified injury of unspecified ankle, initial encounter: Secondary | ICD-10-CM | POA: Diagnosis not present

## 2014-04-16 DIAGNOSIS — Z880 Allergy status to penicillin: Secondary | ICD-10-CM | POA: Insufficient documentation

## 2014-04-16 DIAGNOSIS — S59919A Unspecified injury of unspecified forearm, initial encounter: Secondary | ICD-10-CM

## 2014-04-16 DIAGNOSIS — M25511 Pain in right shoulder: Secondary | ICD-10-CM

## 2014-04-16 DIAGNOSIS — S6990XA Unspecified injury of unspecified wrist, hand and finger(s), initial encounter: Secondary | ICD-10-CM

## 2014-04-16 DIAGNOSIS — M25561 Pain in right knee: Secondary | ICD-10-CM

## 2014-04-16 DIAGNOSIS — Z8632 Personal history of gestational diabetes: Secondary | ICD-10-CM | POA: Diagnosis not present

## 2014-04-16 DIAGNOSIS — S59909A Unspecified injury of unspecified elbow, initial encounter: Secondary | ICD-10-CM | POA: Insufficient documentation

## 2014-04-16 DIAGNOSIS — S46909A Unspecified injury of unspecified muscle, fascia and tendon at shoulder and upper arm level, unspecified arm, initial encounter: Secondary | ICD-10-CM | POA: Diagnosis not present

## 2014-04-16 HISTORY — DX: Gestational diabetes mellitus in pregnancy, unspecified control: O24.419

## 2014-04-16 LAB — BASIC METABOLIC PANEL
Anion gap: 13 (ref 5–15)
BUN: 8 mg/dL (ref 6–23)
CALCIUM: 9.1 mg/dL (ref 8.4–10.5)
CO2: 26 mEq/L (ref 19–32)
Chloride: 102 mEq/L (ref 96–112)
Creatinine, Ser: 0.8 mg/dL (ref 0.50–1.10)
GFR calc Af Amer: >90 mL/min (ref >90)
GLUCOSE: 95 mg/dL (ref 70–99)
Potassium: 3.6 mEq/L — ABNORMAL LOW (ref 3.7–5.3)
SODIUM: 141 meq/L (ref 137–147)

## 2014-04-16 MED ORDER — MORPHINE SULFATE 4 MG/ML IJ SOLN
4.0000 mg | Freq: Once | INTRAMUSCULAR | Status: AC
Start: 2014-04-16 — End: 2014-04-16
  Administered 2014-04-16: 4 mg via INTRAVENOUS
  Filled 2014-04-16: qty 1

## 2014-04-16 MED ORDER — DIAZEPAM 5 MG/ML IJ SOLN
5.0000 mg | Freq: Once | INTRAMUSCULAR | Status: AC
  Administered 2014-04-16: 5 mg via INTRAVENOUS
  Filled 2014-04-16: qty 2

## 2014-04-16 MED ORDER — ACETAMINOPHEN 325 MG PO TABS
650.0000 mg | ORAL_TABLET | Freq: Once | ORAL | Status: AC
  Administered 2014-04-16: 650 mg via ORAL
  Filled 2014-04-16: qty 2
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
## 2014-04-16 NOTE — ED Notes (Signed)
Pt in xray. Workmans comp at bedside to do their own drug screen and breathalyzer.
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
## 2014-04-17 ENCOUNTER — Encounter (HOSPITAL_COMMUNITY): Payer: Self-pay

## 2014-04-17 ENCOUNTER — Emergency Department (HOSPITAL_COMMUNITY): Payer: Worker's Compensation

## 2014-04-17 MED ORDER — MELOXICAM 15 MG PO TABS: 15.0000 mg | ORAL_TABLET | Freq: Every day | ORAL | Status: DC

## 2014-04-17 MED ORDER — METHOCARBAMOL 500 MG PO TABS: 500.0000 mg | ORAL_TABLET | Freq: Two times a day (BID) | ORAL | Status: DC

## 2014-04-17 MED ORDER — IOHEXOL 300 MG/ML  SOLN
100.0000 mL | Freq: Once | INTRAMUSCULAR | Status: AC | PRN
  Administered 2014-04-17: 100 mL via INTRAVENOUS

## 2014-04-17 MED ORDER — HYDROCODONE-ACETAMINOPHEN 5-325 MG PO TABS: 1.0000 | ORAL_TABLET | ORAL | Status: DC | PRN

## 2014-04-17 MED ORDER — OXYCODONE-ACETAMINOPHEN 10-325 MG PO TABS: 1.0000 | ORAL_TABLET | ORAL | Status: DC | PRN
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
## 2014-04-25 ENCOUNTER — Emergency Department (HOSPITAL_COMMUNITY): Payer: Worker's Compensation

## 2014-04-25 ENCOUNTER — Emergency Department (HOSPITAL_COMMUNITY)
Admission: EM | Admit: 2014-04-25 | Discharge: 2014-04-26 | Disposition: A | Discharge status: Home / Self Care | Payer: Worker's Compensation | Attending: Emergency Medicine | Admitting: Emergency Medicine

## 2014-04-25 DIAGNOSIS — Z791 Long term (current) use of non-steroidal anti-inflammatories (NSAID): Secondary | ICD-10-CM | POA: Diagnosis not present

## 2014-04-25 DIAGNOSIS — Z880 Allergy status to penicillin: Secondary | ICD-10-CM | POA: Insufficient documentation

## 2014-04-25 DIAGNOSIS — M25519 Pain in unspecified shoulder: Secondary | ICD-10-CM | POA: Diagnosis present

## 2014-04-25 DIAGNOSIS — G8911 Acute pain due to trauma: Secondary | ICD-10-CM | POA: Insufficient documentation

## 2014-04-25 DIAGNOSIS — Z8632 Personal history of gestational diabetes: Secondary | ICD-10-CM | POA: Diagnosis not present

## 2014-04-25 DIAGNOSIS — R1013 Epigastric pain: Secondary | ICD-10-CM | POA: Insufficient documentation

## 2014-04-25 DIAGNOSIS — R110 Nausea: Secondary | ICD-10-CM | POA: Insufficient documentation

## 2014-04-25 DIAGNOSIS — M549 Dorsalgia, unspecified: Secondary | ICD-10-CM | POA: Diagnosis not present

## 2014-04-25 DIAGNOSIS — M25511 Pain in right shoulder: Secondary | ICD-10-CM

## 2014-04-25 LAB — BASIC METABOLIC PANEL
Anion gap: 13 (ref 5–15)
BUN: 13 mg/dL (ref 6–23)
CALCIUM: 8.9 mg/dL (ref 8.4–10.5)
CHLORIDE: 101 meq/L (ref 96–112)
CO2: 24 mEq/L (ref 19–32)
CREATININE: 0.76 mg/dL (ref 0.50–1.10)
Glucose, Bld: 118 mg/dL — ABNORMAL HIGH (ref 70–99)
Potassium: 3.5 mEq/L — ABNORMAL LOW (ref 3.7–5.3)
Sodium: 138 mEq/L (ref 137–147)

## 2014-04-25 LAB — CBC
HCT: 40.2 % (ref 36.0–46.0)
Hemoglobin: 13.4 g/dL (ref 12.0–15.0)
MCH: 28.1 pg (ref 26.0–34.0)
MCHC: 33.3 g/dL (ref 30.0–36.0)
MCV: 84.3 fL (ref 78.0–100.0)
PLATELETS: 265 10*3/uL (ref 150–400)
RBC: 4.77 MIL/uL (ref 3.87–5.11)
RDW: 13.7 % (ref 11.5–15.5)
WBC: 9.9 10*3/uL (ref 4.0–10.5)

## 2014-04-25 LAB — I-STAT TROPONIN, ED: Troponin i, poc: 0 ng/mL (ref 0.00–0.08)

## 2014-04-25 MED ORDER — HYDROMORPHONE HCL PF 1 MG/ML IJ SOLN
1.0000 mg | Freq: Once | INTRAMUSCULAR | Status: AC
  Administered 2014-04-26: 1 mg via INTRAMUSCULAR
  Filled 2014-04-25: qty 1

## 2014-04-25 MED ORDER — KETOROLAC TROMETHAMINE 60 MG/2ML IM SOLN
60.0000 mg | Freq: Once | INTRAMUSCULAR | Status: AC
  Administered 2014-04-26: 60 mg via INTRAMUSCULAR
  Filled 2014-04-25: qty 2
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
## 2014-04-26 LAB — URINALYSIS, ROUTINE W REFLEX MICROSCOPIC
GLUCOSE, UA: NEGATIVE mg/dL
HGB URINE DIPSTICK: NEGATIVE
KETONES UR: NEGATIVE mg/dL
Leukocytes, UA: NEGATIVE
Nitrite: NEGATIVE
Protein, ur: NEGATIVE mg/dL
Specific Gravity, Urine: 1.034 — ABNORMAL HIGH (ref 1.005–1.030)
Urobilinogen, UA: 0.2 mg/dL (ref 0.0–1.0)
pH: 5.5 (ref 5.0–8.0)

## 2014-04-26 MED ORDER — ONDANSETRON 4 MG PO TBDP
ORAL_TABLET | ORAL | Status: DC
Start: 2014-04-26 — End: 2014-09-16
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
## 2014-04-26 NOTE — Discharge Instructions (Signed)
Use ice and naproxen or ibuprofen as needed for pain. Gradually increase range of motion in the shoulder and arm. Take Zofran as needed for nausea. Followup closely with her primary Dr. in orthopedics surgery as he likely will need an MRI of your shoulder with symptoms persisting however that decision is up to the orthopedic surgeon.  If you were given medicines take as directed.  If you are on coumadin or contraceptives realize their levels and effectiveness is altered by many different medicines.  If you have any reaction (rash, tongues swelling, other) to the medicines stop taking and see a physician.   Please follow up as directed and return to the ER or see a physician for new or worsening symptoms.  Thank you. Filed Vitals:   04/25/14 2002 04/25/14 2223 04/25/14 2330  BP: 128/84 139/93 143/89  Pulse: 72 71 76  Temp: 98.7 F (37.1 C) 98.7 F (37.1 C)   TempSrc: Oral Oral   Resp: 20    Height:  (1.702 m)    SpO2: 99% 100% 95%

## 2014-09-15 ENCOUNTER — Encounter (HOSPITAL_COMMUNITY): Payer: Self-pay | Admitting: Emergency Medicine

## 2014-09-15 DIAGNOSIS — Z791 Long term (current) use of non-steroidal anti-inflammatories (NSAID): Secondary | ICD-10-CM | POA: Diagnosis not present

## 2014-09-15 DIAGNOSIS — Z3202 Encounter for pregnancy test, result negative: Secondary | ICD-10-CM | POA: Insufficient documentation

## 2014-09-15 DIAGNOSIS — A049 Bacterial intestinal infection, unspecified: Secondary | ICD-10-CM | POA: Insufficient documentation

## 2014-09-15 DIAGNOSIS — R109 Unspecified abdominal pain: Secondary | ICD-10-CM | POA: Diagnosis present

## 2014-09-15 DIAGNOSIS — Z880 Allergy status to penicillin: Secondary | ICD-10-CM | POA: Diagnosis not present

## 2014-09-15 DIAGNOSIS — Z79899 Other long term (current) drug therapy: Secondary | ICD-10-CM | POA: Insufficient documentation

## 2014-09-15 DIAGNOSIS — M545 Low back pain: Secondary | ICD-10-CM | POA: Diagnosis not present

## 2014-09-15 DIAGNOSIS — Z8632 Personal history of gestational diabetes: Secondary | ICD-10-CM | POA: Diagnosis not present

## 2014-09-15 LAB — CBC WITH DIFFERENTIAL/PLATELET
Basophils Absolute: 0 10*3/uL (ref 0.0–0.1)
Basophils Relative: 0 % (ref 0–1)
Eosinophils Absolute: 0 10*3/uL (ref 0.0–0.7)
Eosinophils Relative: 0 % (ref 0–5)
HCT: 39.9 % (ref 36.0–46.0)
Hemoglobin: 13.1 g/dL (ref 12.0–15.0)
Lymphocytes Relative: 11 % — ABNORMAL LOW (ref 12–46)
Lymphs Abs: 1.4 10*3/uL (ref 0.7–4.0)
MCH: 27.3 pg (ref 26.0–34.0)
MCHC: 32.8 g/dL (ref 30.0–36.0)
MCV: 83.1 fL (ref 78.0–100.0)
MONO ABS: 0.7 10*3/uL (ref 0.1–1.0)
MONOS PCT: 6 % (ref 3–12)
NEUTROS ABS: 10 10*3/uL — AB (ref 1.7–7.7)
NEUTROS PCT: 83 % — AB (ref 43–77)
Platelets: 247 10*3/uL (ref 150–400)
RBC: 4.8 MIL/uL (ref 3.87–5.11)
RDW: 13.9 % (ref 11.5–15.5)
WBC: 12.1 10*3/uL — ABNORMAL HIGH (ref 4.0–10.5)

## 2014-09-15 LAB — COMPREHENSIVE METABOLIC PANEL
ALT: 15 U/L (ref 0–35)
AST: 20 U/L (ref 0–37)
Albumin: 4.1 g/dL (ref 3.5–5.2)
Alkaline Phosphatase: 61 U/L (ref 39–117)
Anion gap: 9 (ref 5–15)
BILIRUBIN TOTAL: 0.9 mg/dL (ref 0.3–1.2)
BUN: 8 mg/dL (ref 6–23)
CO2: 26 mmol/L (ref 19–32)
Calcium: 8.5 mg/dL (ref 8.4–10.5)
Chloride: 101 mmol/L (ref 96–112)
Creatinine, Ser: 0.8 mg/dL (ref 0.50–1.10)
GFR calc Af Amer: >90 mL/min (ref >90)
GLUCOSE: 87 mg/dL (ref 70–99)
POTASSIUM: 3.3 mmol/L — AB (ref 3.5–5.1)
SODIUM: 136 mmol/L (ref 135–145)
Total Protein: 7.5 g/dL (ref 6.0–8.3)

## 2014-09-15 LAB — LIPASE, BLOOD: LIPASE: 22 U/L (ref 11–59)

## 2014-09-15 NOTE — ED Notes (Signed)
Pt. reports RLQ pain , low back pain with emesis and diarrhea onset today , seen at [facility] advised to go to ER for further testing .

## 2014-09-16 ENCOUNTER — Emergency Department (HOSPITAL_COMMUNITY)
Admission: EM | Admit: 2014-09-16 | Discharge: 2014-09-16 | Disposition: A | Discharge status: Home / Self Care | Payer: BC Managed Care – PPO | Attending: Emergency Medicine | Admitting: Emergency Medicine

## 2014-09-16 ENCOUNTER — Emergency Department (HOSPITAL_COMMUNITY): Payer: BC Managed Care – PPO

## 2014-09-16 DIAGNOSIS — R1011 Right upper quadrant pain: Secondary | ICD-10-CM

## 2014-09-16 DIAGNOSIS — A049 Bacterial intestinal infection, unspecified: Secondary | ICD-10-CM

## 2014-09-16 LAB — URINALYSIS, ROUTINE W REFLEX MICROSCOPIC
BILIRUBIN URINE: NEGATIVE
Glucose, UA: NEGATIVE mg/dL
HGB URINE DIPSTICK: NEGATIVE
Ketones, ur: NEGATIVE mg/dL
Leukocytes, UA: NEGATIVE
NITRITE: NEGATIVE
PROTEIN: NEGATIVE mg/dL
SPECIFIC GRAVITY, URINE: 1.025 (ref 1.005–1.030)
Urobilinogen, UA: 1 mg/dL (ref 0.0–1.0)
pH: 6 (ref 5.0–8.0)

## 2014-09-16 LAB — PREGNANCY, URINE: Preg Test, Ur: NEGATIVE

## 2014-09-16 MED ORDER — MORPHINE SULFATE 4 MG/ML IJ SOLN
4.0000 mg | Freq: Once | INTRAMUSCULAR | Status: AC
  Administered 2014-09-16: 4 mg via INTRAVENOUS
  Filled 2014-09-16: qty 1

## 2014-09-16 MED ORDER — ONDANSETRON HCL 4 MG/2ML IJ SOLN
4.0000 mg | Freq: Once | INTRAMUSCULAR | Status: AC
  Administered 2014-09-16: 4 mg via INTRAVENOUS
  Filled 2014-09-16: qty 2

## 2014-09-16 MED ORDER — TRAMADOL HCL 50 MG PO TABS: 50.0000 mg | ORAL_TABLET | Freq: Four times a day (QID) | ORAL | Status: DC | PRN

## 2014-09-16 MED ORDER — ONDANSETRON 4 MG PO TBDP: ORAL_TABLET | ORAL | Status: DC

## 2014-09-16 MED ORDER — SODIUM CHLORIDE 0.9 % IV BOLUS (SEPSIS)
2000.0000 mL | Freq: Once | INTRAVENOUS | Status: AC
  Administered 2014-09-16: 2000 mL via INTRAVENOUS

## 2014-09-16 MED ORDER — CIPROFLOXACIN IN D5W 400 MG/200ML IV SOLN
400.0000 mg | Freq: Once | INTRAVENOUS | Status: AC
  Administered 2014-09-16: 400 mg via INTRAVENOUS
  Filled 2014-09-16: qty 200

## 2014-09-16 MED ORDER — CIPROFLOXACIN HCL 500 MG PO TABS: 500.0000 mg | ORAL_TABLET | Freq: Two times a day (BID) | ORAL | Status: DC

## 2014-09-16 MED ORDER — DICYCLOMINE HCL 20 MG PO TABS: 20.0000 mg | ORAL_TABLET | Freq: Two times a day (BID) | ORAL | Status: DC | PRN
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
## 2014-09-16 NOTE — ED Notes (Signed)
Pt a/o x 4 on d/c in wheelchair. 

## 2014-09-16 NOTE — Discharge Instructions (Signed)
Abdominal Pain, Women °Abdominal (stomach, pelvic, or belly) pain can be caused by many things. It is important to tell your doctor: °· The location of the pain. °· Does it come and go or is it present all the time? °· Are there things that start the pain (eating certain foods, exercise)? °· Are there other symptoms associated with the pain (fever, nausea, vomiting, diarrhea)? °All of this is helpful to know when trying to find the cause of the pain. °CAUSES  °· Stomach: virus or bacteria infection, or ulcer. °· Intestine: appendicitis (inflamed appendix), regional ileitis (Crohn's disease), ulcerative colitis (inflamed colon), irritable bowel syndrome, diverticulitis (inflamed diverticulum of the colon), or cancer of the stomach or intestine. °· Gallbladder disease or stones in the gallbladder. °· Kidney disease, kidney stones, or infection. °· Pancreas infection or cancer. °· Fibromyalgia (pain disorder). °· Diseases of the female organs: °¨ Uterus: fibroid (non-cancerous) tumors or infection. °¨ Fallopian tubes: infection or tubal pregnancy. °¨ Ovary: cysts or tumors. °¨ Pelvic adhesions (scar tissue). °¨ Endometriosis (uterus lining tissue growing in the pelvis and on the pelvic organs). °¨ Pelvic congestion syndrome (female organs filling up with blood just before the menstrual period). °¨ Pain with the menstrual period. °¨ Pain with ovulation (producing an egg). °¨ Pain with an IUD (intrauterine device, birth control) in the uterus. °¨ Cancer of the female organs. °· Functional pain (pain not caused by a disease, may improve without treatment). °· Psychological pain. °· Depression. °DIAGNOSIS  °Your doctor will decide the seriousness of your pain by doing an examination. °· Blood tests. °· X-rays. °· Ultrasound. °· CT scan (computed tomography, special type of X-ray). °· MRI (magnetic resonance imaging). °· Cultures, for infection. °· Barium enema (dye inserted in the large intestine, to better view it with  X-rays). °· Colonoscopy (looking in intestine with a lighted tube). °· Laparoscopy (minor surgery, looking in abdomen with a lighted tube). °· Major abdominal exploratory surgery (looking in abdomen with a large incision). °TREATMENT  °The treatment will depend on the cause of the pain.  °· Many cases can be observed and treated at home. °· Over-the-counter medicines recommended by your caregiver. °· Prescription medicine. °· Antibiotics, for infection. °· Birth control pills, for painful periods or for ovulation pain. °· Hormone treatment, for endometriosis. °· Nerve blocking injections. °· Physical therapy. °· Antidepressants. °· Counseling with a psychologist or psychiatrist. °· Minor or major surgery. °HOME CARE INSTRUCTIONS  °· Do not take laxatives, unless directed by your caregiver. °· Take over-the-counter pain medicine only if ordered by your caregiver. Do not take aspirin because it can cause an upset stomach or bleeding. °· Try a clear liquid diet (broth or water) as ordered by your caregiver. Slowly move to a bland diet, as tolerated, if the pain is related to the stomach or intestine. °· Have a thermometer and take your temperature several times a day, and record it. °· Bed rest and sleep, if it helps the pain. °· Avoid sexual intercourse, if it causes pain. °· Avoid stressful situations. °· Keep your follow-up appointments and tests, as your caregiver orders. °· If the pain does not go away with medicine or surgery, you may try: °¨ Acupuncture. °¨ Relaxation exercises (yoga, meditation). °¨ Group therapy. °¨ Counseling. °SEEK MEDICAL CARE IF:  °· You notice certain foods cause stomach pain. °· Your home care treatment is not helping your pain. °· You need stronger pain medicine. °· You want your IUD removed. °· You feel faint or   lightheaded. °· You develop nausea and vomiting. °· You develop a rash. °· You are having side effects or an allergy to your medicine. °SEEK IMMEDIATE MEDICAL CARE IF:  °· Your  pain does not go away or gets worse. °· You have a fever. °· Your pain is felt only in portions of the abdomen. The right side could possibly be appendicitis. The left lower portion of the abdomen could be colitis or diverticulitis. °· You are passing blood in your stools (bright red or black tarry stools, with or without vomiting). °· You have blood in your urine. °· You develop chills, with or without a fever. °· You pass out. °MAKE SURE YOU:  °· Understand these instructions. °· Will watch your condition. °· Will get help right away if you are not doing well or get worse. °Document Released: 06/05/2007 Document Revised: 12/23/2013 Document Reviewed: 06/25/2009 °[employer]® Patient Information ©2015 [employer]. This information is not intended to replace advice given to you by your health care provider. Make sure you discuss any questions you have with your health care provider. ° °
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
## 2015-12-25 ENCOUNTER — Other Ambulatory Visit (HOSPITAL_COMMUNITY)
Admission: RE | Admit: 2015-12-25 | Discharge: 2015-12-25 | Disposition: A | Discharge status: Home / Self Care | Payer: BC Managed Care – PPO | Source: Ambulatory Visit | Attending: Family Medicine | Admitting: Family Medicine

## 2015-12-25 ENCOUNTER — Other Ambulatory Visit: Payer: Self-pay | Admitting: Family Medicine

## 2015-12-25 DIAGNOSIS — Z124 Encounter for screening for malignant neoplasm of cervix: Secondary | ICD-10-CM | POA: Insufficient documentation

## 2015-12-29 LAB — CYTOLOGY - PAP

## 2018-01-30 ENCOUNTER — Ambulatory Visit (HOSPITAL_COMMUNITY)
Admission: EM | Admit: 2018-01-30 | Discharge: 2018-01-30 | Disposition: A | Discharge status: Home / Self Care | Payer: BC Managed Care – PPO | Attending: Family Medicine | Admitting: Family Medicine

## 2018-01-30 ENCOUNTER — Ambulatory Visit (INDEPENDENT_AMBULATORY_CARE_PROVIDER_SITE_OTHER): Payer: Self-pay

## 2018-01-30 ENCOUNTER — Encounter (HOSPITAL_COMMUNITY): Payer: Self-pay | Admitting: Family Medicine

## 2018-01-30 DIAGNOSIS — R0789 Other chest pain: Secondary | ICD-10-CM

## 2018-01-30 DIAGNOSIS — R0602 Shortness of breath: Secondary | ICD-10-CM

## 2018-01-30 DIAGNOSIS — K5900 Constipation, unspecified: Secondary | ICD-10-CM

## 2018-01-30 DIAGNOSIS — K219 Gastro-esophageal reflux disease without esophagitis: Secondary | ICD-10-CM

## 2018-01-30 MED ORDER — OMEPRAZOLE 20 MG PO CPDR: 20.0000 mg | DELAYED_RELEASE_CAPSULE | Freq: Every day | ORAL | 0 refills | Status: AC

## 2018-01-30 MED ORDER — POLYETHYLENE GLYCOL 3350 17 GM/SCOOP PO POWD: 17.0000 g | Freq: Every day | ORAL | 0 refills | Status: AC

## 2018-01-30 MED ORDER — GI COCKTAIL ~~LOC~~
30.0000 mL | Freq: Once | ORAL | Status: AC
  Administered 2018-01-30: 30 mL via ORAL

## 2018-01-30 MED ORDER — GI COCKTAIL ~~LOC~~
ORAL | Status: AC
  Filled 2018-01-30: qty 30

## 2018-01-30 NOTE — ED Triage Notes (Signed)
Pt here for chest pain, SOB and lymphadenopathy. sts that her chest hurts when she burps. No cough. sts that she feels like something is stuck in her chest, mucous, and she is drained. Most of these symptoms have been x 4 days. sts also some right arm numbness and tingling.

## 2018-01-30 NOTE — ED Provider Notes (Signed)
[facility]    CSN: [id number] Arrival date & time: 01/30/18  1444     History   Chief Complaint Chief Complaint  Patient presents with  . Shortness of Breath  . Chest Pain    HPI [name] is a 42 y.o. female.   [name] presents with symptoms which started approximately 3-4 days ago. States she has felt somewhat short of breath as well as fatigued. Occasional nausea, worse after eating. Feels like "something is in" her chest. Belches, worse after eating. Has had belching off and on for a long time, but states it has become more regular. Takes tums sometimes which helps. No cough. Has vomited twice. States she has felt her lymph nodes swelled, have improved some. Takes daily claritin. Still has some ear congestion. She states her throat isn't sore but is irritated and a bad taste in her mouth. No fevers. States her right arm feels intermittently tingly. has had alternating loose stools and constipation. Today has felt constipated. Hx of gestational diabetes, belching, elevated BP. No cardiac history. No early family cardiac history. Does not smoke. Taking claritin only at this time.   ROS per HPI.      Past Medical History:  Diagnosis Date  . Gestational diabetes     Patient Active Problem List   Diagnosis Date Noted  . ALLERGIC RHINITIS 10/10/2008  . MICROSCOPIC HEMATURIA 10/10/2008  . VAGINAL DISCHARGE 10/10/2008  . BELCHING 10/10/2008  . ELEVATED BLOOD PRESSURE WITHOUT DIAGNOSIS OF HYPERTENSION 10/10/2008  . VIRAL INFECTION, ACUTE 05/27/2008  . HEMORRHOIDS, EXTERNAL 11/19/2007  . CYSTOCELE WITHOUT MENTION UTERINE PROLAPSE MIDLN 11/19/2007    History reviewed. No pertinent surgical history.  OB History   None      Home Medications    Prior to Admission medications   Medication Sig Start Date End Date Taking? Authorizing Provider  loratadine (CLARITIN) 10 MG tablet Take 10 mg by mouth daily.   Yes [provider]  omeprazole  (PRILOSEC) 20 MG capsule Take 1 capsule (20 mg total) by mouth daily. 01/30/18   Linus Mako B, NP  polyethylene glycol powder (GLYCOLAX/MIRALAX) powder Take 17 g by mouth daily. 01/30/18   Georgetta Haber, NP    Family History History reviewed. No pertinent family history.  Social History Social History   Tobacco Use  . Smoking status: Never Smoker  Substance Use Topics  . Alcohol use: No  . Drug use: No     Allergies   Penicillins   Review of Systems Review of Systems   Physical Exam Triage Vital Signs ED Triage Vitals  Enc Vitals Group     BP 01/30/18 1613 (!) 158/105     Pulse Rate 01/30/18 1613 89     Resp 01/30/18 1613 18     Temp 01/30/18 1613 98.5 F (36.9 C)     Temp src --      SpO2 01/30/18 1613 99 %     Weight --      Height --      Head Circumference --      Peak Flow --      Pain Score 01/30/18 1610 0     Pain Loc --      Pain Edu? --      Excl. in GC? --    No data found.  Updated Vital Signs BP (!) 158/105   Pulse 89   Temp 98.5 F (36.9 C)   Resp 18   LMP 01/23/2018  SpO2 99%    Physical Exam  Constitutional: She is oriented to person, place, and time. She appears well-developed and well-nourished. No distress.  HENT:  Head: Normocephalic and atraumatic.  Right Ear: Tympanic membrane, external ear and ear canal normal.  Left Ear: Tympanic membrane, external ear and ear canal normal.  Nose: Nose normal.  Mouth/Throat: Uvula is midline, oropharynx is clear and moist and mucous membranes are normal. No tonsillar exudate.  Eyes: Pupils are equal, round, and reactive to light. Conjunctivae and EOM are normal.  Cardiovascular: Normal rate, regular rhythm and normal heart sounds.  Pulmonary/Chest: Effort normal and breath sounds normal.  Abdominal: Soft. Bowel sounds are normal. There is tenderness. There is no rigidity, no rebound, no guarding, no CVA tenderness, no tenderness at McBurney's point and negative Murphy's sign.    Mild  right mid/low abdominal pain without rebound  Neurological: She is alert and oriented to person, place, and time.  Skin: Skin is warm and dry.   EKG nrs rate of 85 without acute changes.   UC Treatments / Results  Labs (all labs ordered are listed, but only abnormal results are displayed) Labs Reviewed - No data to display  EKG None  Radiology Dg Chest 2 View  Result Date: 01/30/2018 CLINICAL DATA:  Chest pain, shortness of breath EXAM: CHEST - 2 VIEW COMPARISON:  04/25/2014 FINDINGS: The heart size and mediastinal contours are within normal limits. Both lungs are clear. The visualized skeletal structures are unremarkable. IMPRESSION: No active cardiopulmonary disease. Electronically Signed   By: Elige KoHetal  Patel   On: 01/30/2018 16:36    Procedures Procedures (including critical care time)  Medications Ordered in UC Medications  gi cocktail (Maalox,Lidocaine,Donnatal) (30 mLs Oral Given 01/30/18 1648)    Initial Impression / Assessment and Plan / UC Course  I have reviewed the triage vital signs and the nursing notes.  Pertinent labs & imaging results that were available during my care of the patient were reviewed by me and considered in my medical decision making (see chart for details).     Non toxic in appearance. Afebrile. Primarily GI complaints. Non specific abdominal exam. Without significant cardiac risk factors. ekg and cxray reassuring today. Afebrile. Improvement of gas symptoms with gi cocktail here in clinic. Encouraged establish with a PCP in the next 2 weeks for recheck and bp recheck. Return precautions provided. Patient verbalized understanding and agreeable to plan.  Ambulatory out of clinic without difficulty.    Final Clinical Impressions(s) / UC Diagnoses   Final diagnoses:  Gastroesophageal reflux disease, esophagitis presence not specified  Constipation, unspecified constipation type     Discharge Instructions     Your EKG and chest xray look well  today. This does not appear consistent with cardiac source of symptoms. Symptoms with belching etc likely related to reflux, we will start medications for this, take once daily to prevent symptoms. Also concerned about constipation, you may use miralax in 8oz of water daily to promote regular bowel movements. Please recheck with your primary care provider in the next 1-2 weeks for recheck of symptoms. If develop worsening of pain, shortness of breath, vomiting, dizziness, weakness, fevers, sweating please go to Er.     ED Prescriptions    Medication Sig Dispense Auth. Provider   omeprazole (PRILOSEC) 20 MG capsule Take 1 capsule (20 mg total) by mouth daily. 30 capsule Linus MakoBurky, Brittinie Wherley B, NP   polyethylene glycol powder (GLYCOLAX/MIRALAX) powder Take 17 g by mouth daily. 255 g Linus MakoBurky, Dynasia Kercheval B,  NP     Controlled Substance Prescriptions [place] Controlled Substance Registry consulted? Not Applicable   Georgetta Haber, NP 01/30/18 1707

## 2018-01-30 NOTE — Discharge Instructions (Signed)
Your EKG and chest xray look well today. This does not appear consistent with cardiac source of symptoms. Symptoms with belching etc likely related to reflux, we will start medications for this, take once daily to prevent symptoms. Also concerned about constipation, you may use miralax in 8oz of water daily to promote regular bowel movements. Please recheck with your primary care provider in the next 1-2 weeks for recheck of symptoms. If develop worsening of pain, shortness of breath, vomiting, dizziness, weakness, fevers, sweating please go to Er.

## 2020-12-31 ENCOUNTER — Ambulatory Visit (INDEPENDENT_AMBULATORY_CARE_PROVIDER_SITE_OTHER): Payer: 59 | Admitting: Allergy

## 2020-12-31 ENCOUNTER — Other Ambulatory Visit: Payer: Self-pay

## 2020-12-31 ENCOUNTER — Encounter: Payer: Self-pay | Admitting: Allergy

## 2020-12-31 VITALS — BP 160/102 | HR 90 | Temp 98.0°F | Resp 16 | Ht 67.5 in | Wt 213.8 lb

## 2020-12-31 DIAGNOSIS — J3089 Other allergic rhinitis: Secondary | ICD-10-CM | POA: Diagnosis not present

## 2020-12-31 DIAGNOSIS — L2489 Irritant contact dermatitis due to other agents: Secondary | ICD-10-CM | POA: Diagnosis not present

## 2020-12-31 DIAGNOSIS — H1013 Acute atopic conjunctivitis, bilateral: Secondary | ICD-10-CM | POA: Diagnosis not present

## 2020-12-31 MED ORDER — LEVOCETIRIZINE DIHYDROCHLORIDE 5 MG PO TABS: 5.0000 mg | ORAL_TABLET | Freq: Every evening | ORAL | 5 refills | Status: DC

## 2020-12-31 MED ORDER — OLOPATADINE HCL 0.2 % OP SOLN: 1.0000 [drp] | Freq: Every day | OPHTHALMIC | 5 refills | Status: AC

## 2020-12-31 MED ORDER — AZELASTINE-FLUTICASONE 137-50 MCG/ACT NA SUSP: 2.0000 | NASAL | 5 refills | Status: AC

## 2020-12-31 NOTE — Patient Instructions (Addendum)
Contact Dermatitis -discussed today contact dermatitis which is a delayed skin response to items that come into contact with skin -patch testing is the best testing modality to evaluate for contact dermatitis.  TRUE test patch panels (see below) can be placed on a Monday with return to office on Wednesday and Friday of same week for readings.  If you would like your personal hair products to be tested for you can bring the product in for patch testing as well -continue to avoid products that have caused her scalp to flare -if you have further scalp flare (itching, bumpy, dry, patchy, scaly, flaky, blistering) then you can use dermasmoothe scalp oil which is a steroid to help control dermatitis flare  True Test looks for the following sensitivities:       Environmental allergy -environmental allergy testing is positive to grasses, weeds, trees, molds, dust mites, cockroach -allergen avoidance measures discussed/handouts provided -try Xyzal 5mg  daily.  This is a long acting antihistamine that may work better than Loratadine, Allegra or Zyrtec.   If you have tried this already let know and can recommend another antihistamine option -for itchy/watery eyes can use Olopatadine 0.2% 1 drop each eye daily as needed -trial dymista 1 spray each nostril twice a day.  This is a combination nasal spray with Flonase + Astelin (nasal antihistamine).  This helps with both nasal congestion and drainage.  -allergen immunotherapy discussed today including protocol, benefits and risk.  Informational handout provided.  If interested in this therapuetic option you can check with your insurance carrier for coverage.  Let us know if you would like to proceed with this option.     Follow-up in 4-6 months or sooner if needed (can schedule patch sooner)

## 2020-12-31 NOTE — Progress Notes (Signed)
New Patient Note  RE: [name] MRN: [id number] DOB: 01-Jul-1976 Date of Office Visit: 12/31/2020  Referring provider: Maurice Small, MD Primary care provider: Maurice Small, MD  Chief Complaint: allergies  History of present illness: [name] is a 45 y.o. female presenting today for consultation for allergic rhinitis.   She states her scalp is very sensitive to different things.  She states when she was younger she could use anything on her skin without issue.  However lately she states different hair brand products will irritate her scalp and has cause areas that feel like rough "knots".  She states she is identified that cocoa butter tends to work fine but does not feel that coconut oil is the best to use on scalp.  She does still have access to the products that as she believes has caused some scalp irritation rash.  She takes loratadine daily year-round for years and feels like it has become less effective.  She states symptoms are year-round with sneezing, itchy eyes, nasal congestion/drainage, throat clearing and when its worse can have headache, clogged ear sensation like she is underwater.  She has taken zyrtec, allegra before that didn't help much. She has tried nasal saline flush and states has not done in a long time but tolerated use.  Has also used nasal saline spray.  Has not used any eye drops.   No history of asthma, eczema or food allergy.    Review of systems: Review of Systems  Constitutional: Negative.   HENT: Positive for congestion.        See HPI  Eyes:       See HPI  Respiratory: Negative.   Cardiovascular: Negative.   Gastrointestinal: Negative.   Musculoskeletal: Negative.   Skin: Positive for itching and rash.  Neurological: Negative.     All other systems negative unless noted above in HPI  Past medical history: Past Medical History:  Diagnosis Date  . Gestational diabetes     Past surgical history: History reviewed. No  pertinent surgical history.  Family history:  Family History  Problem Relation Age of Onset  . Allergic rhinitis Mother     Social history: Lives in a home with carpeting with electric heating and central cooling.  No pets in the home.  There has been concern in the past with water damage and mildew in the home.  No concern for roaches in the home.  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  He does not report a smoking history.  Medication List: Current Outpatient Medications  Medication Sig Dispense Refill  . Azelastine-Fluticasone (DYMISTA) 137-50 MCG/ACT SUSP Place 2 sprays into both nostrils 1 day or 1 dose. 23 g 5  . levocetirizine (XYZAL) 5 MG tablet Take 1 tablet (5 mg total) by mouth every evening. 30 tablet 5  . Olopatadine HCl 0.2 % SOLN Apply 1 drop to eye daily. 2.5 mL 5  . omeprazole (PRILOSEC) 20 MG capsule Take 1 capsule (20 mg total) by mouth daily. (Patient not taking: Reported on 12/31/2020) 30 capsule 0  . polyethylene glycol powder (GLYCOLAX/MIRALAX) powder Take 17 g by mouth daily. (Patient not taking: Reported on 12/31/2020) 255 g 0   No current facility-administered medications for this visit.    Known medication allergies: Allergies  Allergen Reactions  . Penicillins     Hives      Physical examination: Blood pressure (!) 160/102, pulse 90, temperature 98 F (36.7 C), resp. rate 16, height 5' 7.5" (1.715 m), weight  213 lb 12.8 oz (97 kg), SpO2 97 %.  General: Alert, interactive, in no acute distress. HEENT: PERRLA, TMs pearly gray, turbinates mildly edematous without discharge, post-pharynx non erythematous. Neck: Supple without lymphadenopathy. Lungs: Clear to auscultation without wheezing, rhonchi or rales. {no increased work of breathing. CV: Normal S1, S2 without murmurs. Abdomen: Nondistended, nontender. Skin: Warm and dry, without lesions or rashes. Extremities:  No clubbing, cyanosis or edema. Neuro:   Grossly intact.  Diagnositics/Labs:  Allergy testing:  Environmental allergy skin prick testing is positive to cocklebur, short ragweed,botryis cinera, Candida albicans, both dust mites. Intradermal testing is positive to [place], [place], weed mix, tree mix, mold mix 2, 3, 4 and cockroach. Allergy testing results were read and interpreted by provider, documented by clinical staff.   Assessment and plan:   Contact Dermatitis -discussed today contact dermatitis which is a delayed skin response to items that come into contact with skin -patch testing is the best testing modality to evaluate for contact dermatitis.  TRUE test patch panels (see below) can be placed on a Monday with return to office on Wednesday and Friday of same week for readings.  If you would like your personal hair products to be tested for you can bring the product in for patch testing as well -continue to avoid products that have caused her scalp to flare -if you have further scalp flare (itching, bumpy, dry, patchy, scaly, flaky, blistering) then you can use dermasmoothe scalp oil which is a steroid to help control dermatitis flare  Allergic rhinitis with conjunctivitis -environmental allergy testing is positive to grasses, weeds, trees, molds, dust mites, cockroach -allergen avoidance measures discussed/handouts provided -try Xyzal 5mg  daily.  This is a long acting antihistamine that may work better than Loratadine, Allegra or Zyrtec.   If you have tried this already let know and can recommend another antihistamine option -for itchy/watery eyes can use Olopatadine 0.2% 1 drop each eye daily as needed -trial dymista 1 spray each nostril twice a day.  This is a combination nasal spray with Flonase + Astelin (nasal antihistamine).  This helps with both nasal congestion and drainage.  -allergen immunotherapy discussed today including protocol, benefits and risk.  Informational handout provided.  If interested in this therapuetic option you can check with your insurance carrier for  coverage.  Let us know if you would like to proceed with this option.     Follow-up in 4-6 months or sooner if needed (can schedule patch sooner)    I appreciate the opportunity to take part in [place] care. Please do not hesitate to contact me with questions.  Sincerely,   [place], MD Allergy/Immunology [facility]

## 2021-01-01 ENCOUNTER — Other Ambulatory Visit: Payer: Self-pay

## 2021-01-01 MED ORDER — FLUTICASONE PROPIONATE 50 MCG/ACT NA SUSP: 2.0000 | Freq: Every day | NASAL | 5 refills | Status: AC

## 2021-01-01 MED ORDER — AZELASTINE HCL 0.1 % NA SOLN: 2.0000 | Freq: Two times a day (BID) | NASAL | 5 refills | Status: AC

## 2021-05-27 ENCOUNTER — Ambulatory Visit: Payer: 59 | Admitting: Allergy

## 2021-07-04 ENCOUNTER — Other Ambulatory Visit: Payer: Self-pay | Admitting: Allergy

## 2021-11-12 ENCOUNTER — Ambulatory Visit: Payer: 59 | Admitting: Podiatry

## 2021-11-12 ENCOUNTER — Other Ambulatory Visit: Payer: Self-pay

## 2021-11-12 DIAGNOSIS — Z79899 Other long term (current) drug therapy: Secondary | ICD-10-CM | POA: Diagnosis not present

## 2021-11-12 DIAGNOSIS — B351 Tinea unguium: Secondary | ICD-10-CM

## 2021-11-16 NOTE — Progress Notes (Signed)
?Subjective:  ?Patient ID: [name], female    DOB: Apr 01, 1976,  MRN: [id number] ? ?Chief Complaint  ?Patient presents with  ? Nail Problem  ?  Discolored nails ?Right hallux pain and swelling   ? ? ?46 y.o. female presents with the above complaint.  Patient presents with complaint bilateral hallux toenails thickened elongated dystrophic toenails.  There is some discoloration.  There is no pain associated with the right is greater than left side.  She would like to discuss treatment options for it.  She has not seen anyone as prior to seeing me.  She denies any other acute complaints. ? ? ?Review of Systems: Negative except as noted in the HPI. Denies N/V/F/Ch. ? ?Past Medical History:  ?Diagnosis Date  ? Gestational diabetes   ? ? ?Current Outpatient Medications:  ?  azelastine (ASTELIN) 0.1 % nasal spray, Place 2 sprays into both nostrils 2 (two) times daily. Use in each nostril as directed, Disp: 30 mL, Rfl: 5 ?  Azelastine-Fluticasone (DYMISTA) 137-50 MCG/ACT SUSP, Place 2 sprays into both nostrils 1 day or 1 dose., Disp: 23 g, Rfl: 5 ?  fluticasone (FLONASE) 50 MCG/ACT nasal spray, Place 2 sprays into both nostrils daily., Disp: 16 g, Rfl: 5 ?  levocetirizine (XYZAL) 5 MG tablet, TAKE 1 TABLET(5 MG) BY MOUTH EVERY EVENING, Disp: 30 tablet, Rfl: 5 ?  Olopatadine HCl 0.2 % SOLN, Apply 1 drop to eye daily., Disp: 2.5 mL, Rfl: 5 ?  omeprazole (PRILOSEC) 20 MG capsule, Take 1 capsule (20 mg total) by mouth daily. (Patient not taking: Reported on 12/31/2020), Disp: 30 capsule, Rfl: 0 ?  polyethylene glycol powder (GLYCOLAX/MIRALAX) powder, Take 17 g by mouth daily. (Patient not taking: Reported on 12/31/2020), Disp: 255 g, Rfl: 0 ? ?Social History  ? ?Tobacco Use  ?Smoking Status Never  ?Smokeless Tobacco Never  ? ? ?Allergies  ?Allergen Reactions  ? Penicillins   ?  Hives   ? ?Objective:  ?There were no vitals filed for this visit. ?There is no height or weight on file to calculate BMI. ?Constitutional Well  developed. ?Well nourished.  ?Vascular Dorsalis pedis pulses palpable bilaterally. ?Posterior tibial pulses palpable bilaterally. ?Capillary refill normal to all digits.  ?No cyanosis or clubbing noted. ?Pedal hair growth normal.  ?Neurologic Normal speech. ?Oriented to person, place, and time. ?Epicritic sensation to light touch grossly present bilaterally.  ?Dermatologic Nails thickened elongated dystrophic mycotic toenails x2 bilateral hallux right greater than left side.  Mild pain on palpation ?Skin normal limits  ?Orthopedic: Normal joint ROM without pain or crepitus bilaterally. ?No visible deformities. ?No bony tenderness.  ? ?Radiographs: None ?Assessment:  ? ?1. Long-term use of high-risk medication   ?2. Nail fungus   ?3. Onychomycosis due to dermatophyte   ? ?Plan:  ?Patient was evaluated and treated and all questions answered. ? ?Right hallux onychomycosis ?-Educated the patient on the etiology of onychomycosis and various treatment options associated with improving the fungal load.  I explained to the patient that there is 3 treatment options available to treat the onychomycosis including topical, p.o., laser treatment.  Patient elected to undergo p.o. options with Lamisil/terbinafine therapy.  In order for me to start the medication therapy, I explained to the patient the importance of evaluating the liver and obtaining the liver function test.  Once the liver function test comes back normal I will start him on 82-month course of Lamisil therapy.  Patient understood all risk and would like to proceed with Lamisil therapy.  I have asked the patient to immediately stop the Lamisil therapy if she has any reactions to it and call the office or go to the emergency room right away.  Patient states understanding ? ? ?No follow-ups on file. ?

## 2022-01-13 ENCOUNTER — Other Ambulatory Visit: Payer: Self-pay | Admitting: Allergy

## 2022-02-10 ENCOUNTER — Other Ambulatory Visit: Payer: Self-pay | Admitting: Allergy

## 2023-02-03 ENCOUNTER — Emergency Department (HOSPITAL_BASED_OUTPATIENT_CLINIC_OR_DEPARTMENT_OTHER)
Admission: EM | Admit: 2023-02-03 | Discharge: 2023-02-03 | Disposition: A | Discharge status: Home / Self Care | Payer: 59 | Attending: Emergency Medicine | Admitting: Emergency Medicine

## 2023-02-03 ENCOUNTER — Emergency Department (HOSPITAL_BASED_OUTPATIENT_CLINIC_OR_DEPARTMENT_OTHER): Payer: 59

## 2023-02-03 ENCOUNTER — Encounter (HOSPITAL_BASED_OUTPATIENT_CLINIC_OR_DEPARTMENT_OTHER): Payer: Self-pay

## 2023-02-03 DIAGNOSIS — L03221 Cellulitis of neck: Secondary | ICD-10-CM | POA: Diagnosis not present

## 2023-02-03 DIAGNOSIS — R221 Localized swelling, mass and lump, neck: Secondary | ICD-10-CM | POA: Diagnosis present

## 2023-02-03 LAB — CBC WITH DIFFERENTIAL/PLATELET
Abs Immature Granulocytes: 0.03 10*3/uL (ref 0.00–0.07)
Basophils Absolute: 0.1 10*3/uL (ref 0.0–0.1)
Basophils Relative: 0 %
Eosinophils Absolute: 0.1 10*3/uL (ref 0.0–0.5)
Eosinophils Relative: 1 %
HCT: 39.7 % (ref 36.0–46.0)
Hemoglobin: 12.9 g/dL (ref 12.0–15.0)
Immature Granulocytes: 0 %
Lymphocytes Relative: 21 %
Lymphs Abs: 2.5 10*3/uL (ref 0.7–4.0)
MCH: 27.9 pg (ref 26.0–34.0)
MCHC: 32.5 g/dL (ref 30.0–36.0)
MCV: 85.7 fL (ref 80.0–100.0)
Monocytes Absolute: 0.6 10*3/uL (ref 0.1–1.0)
Monocytes Relative: 5 %
Neutro Abs: 8.6 10*3/uL — ABNORMAL HIGH (ref 1.7–7.7)
Neutrophils Relative %: 73 %
Platelets: 352 10*3/uL (ref 150–400)
RBC: 4.63 MIL/uL (ref 3.87–5.11)
RDW: 14.4 % (ref 11.5–15.5)
WBC: 11.8 10*3/uL — ABNORMAL HIGH (ref 4.0–10.5)
nRBC: 0 % (ref 0.0–0.2)

## 2023-02-03 LAB — BASIC METABOLIC PANEL
Anion gap: 8 (ref 5–15)
BUN: 8 mg/dL (ref 6–20)
CO2: 27 mmol/L (ref 22–32)
Calcium: 9.6 mg/dL (ref 8.9–10.3)
Chloride: 103 mmol/L (ref 98–111)
Creatinine, Ser: 0.9 mg/dL (ref 0.44–1.00)
GFR, Estimated: >60 mL/min (ref >60)
Glucose, Bld: 75 mg/dL (ref 70–99)
Potassium: 3.6 mmol/L (ref 3.5–5.1)
Sodium: 138 mmol/L (ref 135–145)

## 2023-02-03 LAB — PREGNANCY, URINE: Preg Test, Ur: NEGATIVE

## 2023-02-03 MED ORDER — IBUPROFEN 800 MG PO TABS
800.0000 mg | ORAL_TABLET | Freq: Once | ORAL | Status: AC
  Administered 2023-02-03: 800 mg via ORAL
  Filled 2023-02-03: qty 1

## 2023-02-03 MED ORDER — SUCRALFATE 1 GM/10ML PO SUSP
1.0000 g | Freq: Once | ORAL | Status: AC
  Administered 2023-02-03: 1 g via ORAL
  Filled 2023-02-03: qty 10

## 2023-02-03 MED ORDER — FAMOTIDINE IN NACL 20-0.9 MG/50ML-% IV SOLN
20.0000 mg | INTRAVENOUS | Status: AC
  Administered 2023-02-03: 20 mg via INTRAVENOUS
  Filled 2023-02-03: qty 50

## 2023-02-03 MED ORDER — ONDANSETRON HCL 4 MG PO TABS: 4.0000 mg | ORAL_TABLET | Freq: Three times a day (TID) | ORAL | 0 refills | Status: AC | PRN

## 2023-02-03 MED ORDER — FAMOTIDINE 20 MG PO TABS: 20.0000 mg | ORAL_TABLET | Freq: Two times a day (BID) | ORAL | 0 refills | Status: AC

## 2023-02-03 MED ORDER — IOHEXOL 300 MG/ML  SOLN
100.0000 mL | Freq: Once | INTRAMUSCULAR | Status: AC | PRN
  Administered 2023-02-03: 75 mL via INTRAVENOUS

## 2023-02-03 MED ORDER — SODIUM CHLORIDE 0.9 % IV SOLN
2.0000 g | Freq: Once | INTRAVENOUS | Status: AC
  Administered 2023-02-03: 2 g via INTRAVENOUS
  Filled 2023-02-03: qty 20

## 2023-02-03 MED ORDER — DOXYCYCLINE HYCLATE 100 MG PO CAPS: 100.0000 mg | ORAL_CAPSULE | Freq: Two times a day (BID) | ORAL | 0 refills | Status: AC

## 2023-02-03 MED ORDER — ONDANSETRON 4 MG PO TBDP
4.0000 mg | ORAL_TABLET | Freq: Once | ORAL | Status: AC
  Administered 2023-02-03: 4 mg via ORAL
  Filled 2023-02-03: qty 1

## 2023-02-03 NOTE — Discharge Instructions (Addendum)
It was a pleasure caring for you today. CT was concerning for cellulitis. You have received a dose of IV ABX here in ED and I am sending you home with a prescription for ABX. Please take entire course of ABX even if symptoms resolve. I have also included information to follow up with an ENT provider. Please follow up with ENT at the beginning of next week. Seek emergency care if experiencing any new or worsening symptoms such as troubles breathing, choking, or severe fever.

## 2023-02-03 NOTE — ED Notes (Signed)
Patient transported to CT 

## 2023-02-03 NOTE — ED Provider Notes (Cosign Needed Addendum)
[place] EMERGENCY DEPARTMENT AT [facility] Provider Note   CSN: [id number] Arrival date & time: 02/03/23  1335     History  Chief Complaint  Patient presents with   Facial Swelling    neck    [name] is a 47 y.o. female who presents to ED complaining of swelling underneath chin x1 day. Patient stating that throat swelling like this has happened in the past after she had acute gastroenteritis. She denies recent illness/vomiting. Patient states that she is able to tolerate PO food and liquids. Denies difficulties breathing or recent neck trauma.  Denies choking, dyspnea, fever, chest pain, abdominal pain, nausea, vomiting. Denies dental pain, drooling, dysphagia, stiff neck, sore throat.  HPI     Home Medications Prior to Admission medications   Medication Sig Start Date End Date Taking? Authorizing Provider  doxycycline (VIBRAMYCIN) 100 MG capsule Take 1 capsule (100 mg total) by mouth 2 (two) times daily for 10 days. 02/03/23 02/13/23 Yes Leiya Keesey, Charlotte Sanes F, PA-C  azelastine (ASTELIN) 0.1 % nasal spray Place 2 sprays into both nostrils 2 (two) times daily. Use in each nostril as directed 01/01/21   Marcelyn Bruins, MD  Azelastine-Fluticasone [facility]) 137-50 MCG/ACT SUSP Place 2 sprays into both nostrils 1 day or 1 dose. 12/31/20   Marcelyn Bruins, MD  fluticasone (FLONASE) 50 MCG/ACT nasal spray Place 2 sprays into both nostrils daily. 01/01/21   Marcelyn Bruins, MD  levocetirizine (XYZAL) 5 MG tablet TAKE 1 TABLET(5 MG) BY MOUTH EVERY EVENING 01/13/22   Marcelyn Bruins, MD  Olopatadine HCl 0.2 % SOLN Apply 1 drop to eye daily. 12/31/20   Marcelyn Bruins, MD  omeprazole (PRILOSEC) 20 MG capsule Take 1 capsule (20 mg total) by mouth daily. Patient not taking: Reported on 12/31/2020 01/30/18   Linus Mako B, NP  polyethylene glycol powder (GLYCOLAX/MIRALAX) powder Take 17 g by mouth daily. Patient not taking:  Reported on 12/31/2020 01/30/18   Georgetta Haber, NP      Allergies    Penicillins    Review of Systems   Review of Systems  HENT:         Throat swelling    Physical Exam Updated Vital Signs BP (!) 182/103 (BP Location: Right Arm)   Pulse 70   Temp 98.8 F (37.1 C) (Oral)   Resp 16   SpO2 100%  Physical Exam Vitals and nursing note reviewed.  Constitutional:      General: She is not in acute distress. HENT:     Head: Normocephalic and atraumatic.     Mouth/Throat:     Mouth: Mucous membranes are moist.     Pharynx: No oropharyngeal exudate or posterior oropharyngeal erythema.     Comments: Uvula midline, no oropharyngeal swelling or lesions. Swelling appreciated under chin. Normal active ROM of neck. Eyes:     General: No scleral icterus.       Right eye: No discharge.        Left eye: No discharge.     Conjunctiva/sclera: Conjunctivae normal.  Cardiovascular:     Rate and Rhythm: Normal rate and regular rhythm.     Pulses: Normal pulses.     Heart sounds: Normal heart sounds. No murmur heard. Pulmonary:     Effort: Pulmonary effort is normal. No respiratory distress.     Breath sounds: Normal breath sounds. No wheezing, rhonchi or rales.  Abdominal:     General: Abdomen is flat. Bowel sounds are normal.  Palpations: Abdomen is soft.     Tenderness: There is no abdominal tenderness.  Musculoskeletal:     Right lower leg: No edema.     Left lower leg: No edema.  Skin:    General: Skin is warm and dry.     Findings: No rash.  Neurological:     General: No focal deficit present.     Mental Status: She is alert. Mental status is at baseline.  Psychiatric:        Mood and Affect: Mood normal.     ED Results / Procedures / Treatments   Labs (all labs ordered are listed, but only abnormal results are displayed) Labs Reviewed  CBC WITH DIFFERENTIAL/PLATELET - Abnormal; Notable for the following components:      Result Value   WBC 11.8 (*)    Neutro Abs  8.6 (*)    All other components within normal limits  BASIC METABOLIC PANEL  PREGNANCY, URINE    EKG None  Radiology CT Soft Tissue Neck W Contrast  Result Date: 02/03/2023 CLINICAL DATA:  Soft tissue infection of the neck suspected. Swelling below the mandible. EXAM: CT NECK WITH CONTRAST TECHNIQUE: Multidetector CT imaging of the neck was performed using the standard protocol following the bolus administration of intravenous contrast. RADIATION DOSE REDUCTION: This exam was performed according to the departmental dose-optimization program which includes automated exposure control, adjustment of the mA and/or kV according to patient size and/or use of iterative reconstruction technique. CONTRAST:  75mL OMNIPAQUE IOHEXOL 300 MG/ML  SOLN COMPARISON:  None Available. FINDINGS: Pharynx and larynx: No focal mucosal or submucosal lesions are present. Nasopharynx is clear. Soft palate and tongue base are within normal limits. Oropharynx is otherwise unremarkable. Vallecula and epiglottis are within normal limits. Aryepiglottic folds and piriform sinuses are clear. Vocal cords are midline and symmetric. Trachea is clear. Salivary glands: The submandibular and parotid glands and ducts are within normal limits. Thyroid: Normal Lymph nodes: A right paramedian submental lymph node measures 12 x 8 mm on axial images. At least 4 other somewhat hyperdense submental nodes are present. Subcentimeter right submandibular lymph nodes are present. No suppurative or necrotic nodes are present. No significant cervical adenopathy is present otherwise. Vascular: No significant vascular lesions are present. Limited intracranial: Within normal limits. Visualized orbits: The globes and orbits are within normal limits. Mastoids and visualized paranasal sinuses: The paranasal sinuses and mastoid air cells are clear. Skeleton: Vertebral body heights and alignment are normal. Straightening and slight reversal of the normal cervical  lordosis is present. Upper chest: The lung apices are clear. The thoracic inlet is within normal limits. Other: Thickening of the platysma is noted, right greater than left. There is some stranding above the platysma. Subcutaneous stranding and focal skin thickening is present to the right of midline. No discrete abscess is present. No inflammatory changes are present within the intrinsic tongue muscles. Mylohyoid is within normal limits. IMPRESSION: 1. Thickening of the platysma, right greater than left, compatible with cellulitis. 2. Subcutaneous stranding and focal skin thickening to the right of midline compatible with cellulitis. 3. No discrete abscess. 4. Prominent submental lymph nodes are likely reactive. No suppurative or necrotic nodes are present. Electronically Signed   By: Marin Roberts M.D.   On: 02/03/2023 18:12    Procedures Procedures    Medications Ordered in ED Medications  cefTRIAXone (ROCEPHIN) 2 g in sodium chloride 0.9 % 100 mL IVPB (has no administration in time range)  iohexol (OMNIPAQUE) 300 MG/ML solution  100 mL (75 mLs Intravenous Contrast Given 02/03/23 1723)    ED Course/ Medical Decision Making/ A&P                             Medical Decision Making Amount and/or Complexity of Data Reviewed Labs: ordered. Radiology: ordered.  Risk Prescription drug management.   This patient presents to the ED for concern of throat swelling, this involves an extensive number of treatment options, and is a complaint that carries with it a high risk of complications and morbidity.  The differential diagnosis includes retropharyngeal abscess, tonsillar abscess, other deep space infection, enlarged thyroid/thyroiditis.   Co morbidities that complicate the patient evaluation  none     Lab Tests:  I Ordered, and personally interpreted labs.  The pertinent results include:   -BMP: no concern for electrolyte abnormality; no concern for kidney damage -CBC: No  concern for anemia; mild leukocytosis -hcg: negative    Imaging Studies ordered:  I ordered imaging studies including  -CT soft tissue neck: Thickening of the platysma, right greater than left, compatible with cellulitis. Subcutaneous stranding and focal skin thickening to the right of midline compatible with cellulitis. No discrete abscess. Prominent submental lymph nodes are likely reactive. No suppurative or necrotic nodes are present. I independently visualized and interpreted imaging I agree with the radiologist interpretation    Consultations Obtained:  Staffed patient with  ED Physician Dr. Deretha Emory   Problem List / ED Course / Critical interventions / Medication management  Patient presents to ED concerned for submandibular swelling. Physical exam not concerning for oropharyngeal swelling or lesions. Patient able to tolerate crackers and water in ED. Denies any red flag symptoms such as dental pain, drooling, dysphagia, stiff neck. Labs reassuring with very mild leukocytosis (11.8). Overall, patient is well appearing, afebrile, and with stable vitals.  CT concerning for cellulitis - no concern for abscess. Shared information with patient and recommended close follow up with ENT. Also educated patient to return to ED if swelling gets worse. Patient verbally agreed and endorsed understanding of plan. Patient given dose of ABX here in ED and I will discharge patient with ABX course. Upon discharge, patient complaining of acid reflux - prescribed her GI cocktail which relieved pain. I will also discharge patient with Pepcid for her acid reflux. Patient also stating that she has nausea when taking doxycycline - prescribing her with Zofran. I have reviewed the patients home medicines and have made adjustments as needed Patient afebrile with stable vitals. Patient ready for discharge. Provided patient with strict return precautions.   Social Determinants of  Health:  none           Final Clinical Impression(s) / ED Diagnoses Final diagnoses:  Cellulitis of neck    Rx / DC Orders ED Discharge Orders          Ordered    doxycycline (VIBRAMYCIN) 100 MG capsule  2 times daily        02/03/23 2006              Dorthy Cooler, [place] 02/03/23 2019    Valrie Hart F, [place] 02/03/23 2309    Vanetta Mulders, MD 02/06/23 1214

## 2023-02-03 NOTE — ED Triage Notes (Signed)
Pt c/o midline abscess under jawline. First noticed yesterday, "just gotten much bigger since." Pt c/o associated fatigue, "especially after talking for awhile."Hx of same, "but it's been awhile." Unsure of dx, states that PCP wants to have US/CT
